# Patient Record
Sex: Male | Born: 1955 | Race: Black or African American | Hispanic: No | Marital: Married | Smoking: Former smoker
Health system: Southern US, Community
[De-identification: ages and names within clinical notes are randomized; demographics above are authoritative.]

## PROBLEM LIST (undated history)

## (undated) DIAGNOSIS — E119 Type 2 diabetes mellitus without complications: Secondary | ICD-10-CM

## (undated) DIAGNOSIS — I10 Essential (primary) hypertension: Secondary | ICD-10-CM

## (undated) HISTORY — DX: Type 2 diabetes mellitus without complications: E11.9

---

## 2005-05-09 ENCOUNTER — Emergency Department: Payer: Self-pay | Admitting: Emergency Medicine

## 2005-07-06 ENCOUNTER — Ambulatory Visit: Payer: Self-pay | Admitting: Pain Medicine

## 2005-07-06 ENCOUNTER — Other Ambulatory Visit: Payer: Self-pay

## 2005-07-07 ENCOUNTER — Observation Stay: Payer: Self-pay | Admitting: Internal Medicine

## 2005-07-13 ENCOUNTER — Ambulatory Visit: Payer: Self-pay | Admitting: Pain Medicine

## 2005-08-11 ENCOUNTER — Ambulatory Visit: Payer: Self-pay | Admitting: Pain Medicine

## 2005-10-19 ENCOUNTER — Ambulatory Visit: Payer: Self-pay | Admitting: Pain Medicine

## 2005-10-25 ENCOUNTER — Ambulatory Visit: Payer: Self-pay | Admitting: Pain Medicine

## 2005-12-02 ENCOUNTER — Ambulatory Visit: Payer: Self-pay | Admitting: Pain Medicine

## 2005-12-13 ENCOUNTER — Ambulatory Visit: Payer: Self-pay | Admitting: Pain Medicine

## 2006-01-26 ENCOUNTER — Ambulatory Visit: Payer: Self-pay | Admitting: Pain Medicine

## 2006-02-14 ENCOUNTER — Ambulatory Visit: Payer: Self-pay | Admitting: Pain Medicine

## 2006-03-03 ENCOUNTER — Ambulatory Visit: Payer: Self-pay | Admitting: Pain Medicine

## 2006-03-12 ENCOUNTER — Ambulatory Visit: Payer: Self-pay | Admitting: Neurological Surgery

## 2006-03-31 ENCOUNTER — Ambulatory Visit: Payer: Self-pay | Admitting: Pain Medicine

## 2006-04-06 ENCOUNTER — Ambulatory Visit: Payer: Self-pay | Admitting: Pain Medicine

## 2006-05-21 ENCOUNTER — Emergency Department: Payer: Self-pay | Admitting: Emergency Medicine

## 2006-10-11 ENCOUNTER — Ambulatory Visit: Payer: Self-pay | Admitting: Pain Medicine

## 2006-11-15 ENCOUNTER — Emergency Department: Payer: Self-pay | Admitting: Emergency Medicine

## 2006-11-15 HISTORY — PX: SPINE SURGERY: SHX786

## 2006-11-15 HISTORY — PX: BACK SURGERY: SHX140

## 2006-11-21 ENCOUNTER — Ambulatory Visit: Payer: Self-pay | Admitting: Pain Medicine

## 2007-03-22 ENCOUNTER — Inpatient Hospital Stay (HOSPITAL_COMMUNITY): Admission: RE | Admit: 2007-03-22 | Discharge: 2007-03-24 | Payer: Self-pay | Admitting: Neurological Surgery

## 2007-04-24 ENCOUNTER — Encounter: Admission: RE | Admit: 2007-04-24 | Discharge: 2007-04-24 | Payer: Self-pay | Admitting: Neurological Surgery

## 2007-07-10 ENCOUNTER — Encounter: Admission: RE | Admit: 2007-07-10 | Discharge: 2007-07-10 | Payer: Self-pay | Admitting: Neurological Surgery

## 2008-03-25 ENCOUNTER — Encounter: Admission: RE | Admit: 2008-03-25 | Discharge: 2008-03-25 | Payer: Self-pay | Admitting: Neurological Surgery

## 2008-08-18 ENCOUNTER — Emergency Department: Payer: Self-pay | Admitting: Emergency Medicine

## 2008-10-14 ENCOUNTER — Observation Stay: Payer: Self-pay | Admitting: Internal Medicine

## 2010-01-12 ENCOUNTER — Emergency Department: Payer: Self-pay | Admitting: Emergency Medicine

## 2011-04-02 NOTE — Discharge Summary (Signed)
Gregg Hernandez, Gregg Hernandez NO.:  [id number]   MEDICAL RECORD NO.:  [id number]          PATIENT TYPE:  INP   LOCATION:  3004                         FACILITY:  [facility]   PHYSICIAN:  Tia Alert, MD     DATE OF BIRTH:  08/28/1956   DATE OF ADMISSION:  03/22/2007  DATE OF DISCHARGE:  03/24/2007                               DISCHARGE SUMMARY   ADMISSION DIAGNOSIS:  Lumbar disk herniation, L4-L5, with segmental  instability and degenerative disk disease.   PROCEDURE PERFORMED:  Posterior lumbar interbody fusion with non  segmental instrumentation, L4-L5.   HISTORY OF PRESENT ILLNESS:  Gregg Hernandez is a 55 year old gentleman who  presented with severe back pain.  He has had that for several years.  It  was associated with left leg pain.  He had an MRI scan and a CT scan  which showed significant disk degeneration with a large disk herniation  at L4-L5 to the left.  He had a provocative diskogram which was positive  at L4-L5 with concordant pain with normal controls at L3-L4 and L5-S1.  He had tried medical management for quite some time without significant  relief.  I recommended transforaminal interbody fusion at L4-L5,  followed by non segmental instrumentation.  He understood the risks,  benefits and expected outcome and wished to proceed.   HOSPITAL COURSE:  The patient was admitted on Mar 22, 2007 and taken to  the operating room where he underwent a PLIF at L4-L5 with non segmental  instrumentation.  The patient tolerated the procedure well and was taken  to the recovery room and then to the floor in stable condition.  For  details of the operative procedure, please see the dictated operative  note.   The patient's hospital course was routine.  There were no complications.  The incision remained clean, dry and intact.  He remained afebrile with  stable vital signs.  He was advanced to a regular diet which he  tolerated well.  He remained on a PCA for a couple of  days and then was  weaned off of this and he tolerated oral pain medications quite well.  He was ambulating in the halls without difficulty, he had improvement in  his leg pain, he had appropriate back soreness.  He was discharged to  home in stable condition on Mar 24, 2007, with plans to follow up with  Gregg Hernandez in two weeks.   DISCHARGE DIAGNOSIS:  Posterior lumbar interbody fusion, L4-L5.      Tia Alert, MD  Electronically Signed     DSJ/MEDQ  D:  04/28/2007  T:  04/28/2007  Job:  [id number]

## 2011-04-02 NOTE — Op Note (Signed)
Gregg Hernandez, Gregg Hernandez NO.:  [id number]   MEDICAL RECORD NO.:  [id number]          PATIENT TYPE:  INP   LOCATION:  3172                         FACILITY:  [facility]   PHYSICIAN:  Tia Alert, MD     DATE OF BIRTH:  08-Jun-1956   DATE OF PROCEDURE:  03/22/2007  DATE OF DISCHARGE:                               OPERATIVE REPORT   PREOPERATIVE DIAGNOSIS:  Lumbar disk herniation with degenerative disk  disease L4-5 with back and leg pain.   POSTOPERATIVE DIAGNOSIS:  Lumbar disk herniation with degenerative disk  disease L4-5 with back and leg pain.   PROCEDURE:  1. Decompressive lumbar hemilaminectomy, hemifacetectomy and      foraminotomy L4-5 on the left followed by diskectomy L4-5 on the      left requiring more work than is usually required for the typical      TLIF procedure.  2. Transforaminal lumbar interbody fusion L4-5 on the left utilizing a      9-mm PEEK interbody cage packed local autograft and Actifuse.  The      interspace was packed with local autograft and Actifuse prior to      placement of the cage.  3. Intertransverse arthrodesis L4-5 on the left utilizing autograft      and Actifuse onlay fusion L4-5 on the right utilizing Actifuse and      autograft.  4. Nonsegmental fixation L4-5 utilizing the encompass pedicle screw      system at L4-5 on the left and a Spire plate at Z6-1.   SURGEON:  Tia Alert, M.D.   ASSISTANT:  Dr. Jordan Hernandez.   ANESTHESIA:  General endotracheal.   COMPLICATIONS:  None apparent.   INDICATIONS FOR PROCEDURE:  Gregg Hernandez is a 55 year old gentleman who  presented with severe back pain.  He has had this for several years.  He  had associated left leg pain.  He had an MRI, a CT scan and a  provocative diskogram which showed disk herniation at L4-5 to the left  with significant disk desiccation and collapse of the disk space with a  positive provocative diskogram at L4-5 with normal controls at  L3-4 and  L5-S1.  He  tried medical management for quite some time without  significant relief.  I recommended a transforaminal lumbar interbody  fusion at L4-5 followed by nonsegmental instrumentation.  He understood  the risks, benefits, and expected outcome and wished to proceed.   DESCRIPTION OF PROCEDURE:  The patient was taken to the operating room  and after induction of adequate generalized endotracheal anesthesia he  was rolled into prone position on the Wilson frame.  All pressure points  were padded.  His lumbar region was prepped with DuraPrep and then  draped in the usual sterile fashion.  Ten mL of local anesthesia was  injected, and a dorsal midline incision was made and carried down to the  lumbosacral fascia.  The fascia was opened and the paraspinous  musculature was taken down in a subperiosteal fashion to expose L4-5  bilaterally.  Intraoperative fluoroscopy confirmed my level, and then I  took  the dissection out over the facets bilaterally and placed a self-  retaining retractor.  I performed a hemilaminectomy, hemifacetectomy and  foraminotomy at L4-5 on the left side, identifying the L4 and L5 nerve  roots and decompressing these.  The ligament was removed.  The dura was  retracted medially and a large subannular disk herniation was  identified.  I then incised the disk and performed a thorough  intradiskal diskectomy with pituitary rongeurs.  I then used a series of  curettes to clean the disk space and prepare the endplates for fusion,  reaching across the midline.  I then used distraction to measure the  interspace to 9 mm.  Once the disk space was prepared, I used a mixture  of local autograft and Actifuse and packed this into the L4-5 interspace  into the midline and anteriorly.  I then placed a 9-mm PEEK interbody  cage which was also packed with local autograft and Actifuse and tapped  this into position at L4-5 and then checked this construct with  fluoroscopy.  I then inspected  the nerve roots once again, located the  pedicle screw entry zones at L4 and L5 on the left side using surface  landmarks and lateral fluoroscopy.  I probed each pedicle with a pedicle  probe, tapped each pedicle with a 6.0 tap and then placed 6.5 x 45 mm  pedicle screws into the L4 and L5 pedicles on the left side.  A rod was  then placed in the multiaxial screw heads and locked into position after  achieving compression of the graft.  I then decorticated the transverse  processes at L4-5 on the left side and placed a mixture of Actifuse and  local autograft out over these.  I then used a Spire plate and attached  this to the spinous processes of L4 and L5, locking the plate into the  spinous processes with the locking screw after achieving compression of  the Spire plate itself.  We then irrigated with saline solution  containing bacitracin, decorticated the lamina of L4 and L5 along with  the transverse processes and placed a mixture of local autograft and  Actifuse out over these for only intertransverse arthrodesis at L4-5 on  the right side.  We then inspected the nerve root once again, irrigated  with saline solution containing bacitracin, dried all bleeding points,  lined the dura with Gelfoam, placed a medium Hemovac drain through a  separate stab incision and then closed the fascia with #1 Vicryl, closed  the subcutaneous and subcuticular tissue with 2-0 and 3-0 Vicryl and  closed the skin with benzoin and Steri-Strips.  The drapes were removed.  A sterile dressing was applied.  The patient was awakened from general  anesthesia and transported to the recovery room in stable condition.  At  the end of procedure all sponge, needle and instrument counts were  correct.      Tia Alert, MD  Electronically Signed     DSJ/MEDQ  D:  03/22/2007  T:  03/22/2007  Job:  [id number]

## 2013-11-11 ENCOUNTER — Emergency Department: Payer: Self-pay | Admitting: Emergency Medicine

## 2013-11-11 LAB — TROPONIN I
Troponin-I: <0.02 ng/mL
Troponin-I: <0.02 ng/mL

## 2013-11-11 LAB — BASIC METABOLIC PANEL
Anion Gap: 4 — ABNORMAL LOW (ref 7–16)
BUN: 10 mg/dL (ref 7–18)
Calcium, Total: 8.5 mg/dL (ref 8.5–10.1)
Creatinine: 1.14 mg/dL (ref 0.60–1.30)
Glucose: 392 mg/dL — ABNORMAL HIGH (ref 65–99)
Potassium: 3.6 mmol/L (ref 3.5–5.1)
Sodium: 135 mmol/L — ABNORMAL LOW (ref 136–145)

## 2013-11-11 LAB — CBC
HCT: 41.7 % (ref 40.0–52.0)
HGB: 13.8 g/dL (ref 13.0–18.0)
MCH: 26.9 pg (ref 26.0–34.0)
Platelet: 134 10*3/uL — ABNORMAL LOW (ref 150–440)
RDW: 14 % (ref 11.5–14.5)
WBC: 8.6 10*3/uL (ref 3.8–10.6)

## 2013-11-15 DIAGNOSIS — E119 Type 2 diabetes mellitus without complications: Secondary | ICD-10-CM

## 2013-11-15 HISTORY — DX: Type 2 diabetes mellitus without complications: E11.9

## 2015-01-23 ENCOUNTER — Encounter: Payer: Self-pay | Admitting: *Deleted

## 2015-02-06 ENCOUNTER — Encounter: Payer: Self-pay | Admitting: General Surgery

## 2015-02-06 ENCOUNTER — Ambulatory Visit (INDEPENDENT_AMBULATORY_CARE_PROVIDER_SITE_OTHER): Payer: BC Managed Care – PPO | Admitting: General Surgery

## 2015-02-06 VITALS — BP 130/88 | HR 78 | Resp 14 | Ht 69.0 in | Wt 207.0 lb

## 2015-02-06 DIAGNOSIS — Z1211 Encounter for screening for malignant neoplasm of colon: Secondary | ICD-10-CM | POA: Diagnosis not present

## 2015-02-06 MED ORDER — POLYETHYLENE GLYCOL 3350 17 GM/SCOOP PO POWD: ORAL | Status: DC

## 2015-02-06 NOTE — Progress Notes (Signed)
Patient ID: Gregg Hernandez, male   DOB: 10-09-1956, 59 y.o.   MRN: [id number]  Chief Complaint  Patient presents with  . Colonoscopy    HPI Gregg Hernandez is a 59 y.o. male.  Here today to discuss having a colonoscopy. He has never has one done previously. He does not report any problems with his bowels. He was diagnosed with diabetes in 2015 and modified his diet at that time. His weight is down 33 pounds over the past year. HPI  Past Medical History  Diagnosis Date  . Diabetes mellitus without complication 9407    Past Surgical History  Procedure Laterality Date  . Back surgery  2008  . Spine surgery  2008     lower back ruptured disk, Dr Gregg Hernandez, Gregg Hernandez    History reviewed. No pertinent family history.  Social History History  Substance Use Topics  . Smoking status: Current Every Day Smoker -- 0.50 packs/day for 20 years    Types: Cigarettes  . Smokeless tobacco: Never Used  . Alcohol Use: No    No Known Allergies  Current Outpatient Prescriptions  Medication Sig Dispense Refill  . amLODipine-benazepril (LOTREL) 5-20 MG per capsule Take 1 capsule by mouth daily.     . metFORMIN (GLUCOPHAGE) 500 MG tablet Take 500 mg by mouth 2 (two) times daily with a meal.     . polyethylene glycol powder (GLYCOLAX/MIRALAX) powder 255 grams one bottle for colonoscopy prep 255 g 0   No current facility-administered medications for this visit.    Review of Systems Review of Systems  Constitutional: Negative.   Respiratory: Negative.   Cardiovascular: Negative.   Gastrointestinal: Negative.     Blood pressure 130/88, pulse 78, resp. rate 14, height 5\' 9"  (1.753 m), weight 207 lb (93.895 kg).  Physical Exam Physical Exam  Constitutional: He is oriented to person, place, and time. He appears well-developed and well-nourished.  Eyes: Conjunctivae are normal. No scleral icterus.  Neck: Neck supple.  Cardiovascular: Normal rate, regular rhythm and normal heart sounds.    Pulmonary/Chest: Effort normal and breath sounds normal.  Lymphadenopathy:    He has no cervical adenopathy.  Neurological: He is alert and oriented to person, place, and time.  Skin: Skin is warm and dry.    Data Reviewed PCP notes of 01/10/2015 reviewed. Hemoglobin A1c down at 6.4. Original A1c 10.9.  Assessment    Candidate for screening colonoscopy.     Plan    The pros and cons of screening colonoscopy were reviewed. The risks associated with the procedure including bleeding and perforation were discussed.   Patient has been scheduled for a colonoscopy on 03-19-15 at [facility]. This patient has been asked to hold Metformin day of colonoscopy prep and procedure.      PCP/Ref: Dr Gregg Hernandez, Gregg Hernandez 02/07/2015, 9:47 AM

## 2015-02-06 NOTE — Patient Instructions (Addendum)
Colonoscopy A colonoscopy is an exam to look at the entire large intestine (colon). This exam can help find problems such as tumors, polyps, inflammation, and areas of bleeding. The exam takes about 1 hour.  LET [facility] CARE PROVIDER KNOW ABOUT:   Any allergies you have.  All medicines you are taking, including vitamins, herbs, eye drops, creams, and over-the-counter medicines.  Previous problems you or members of your family have had with the use of anesthetics.  Any blood disorders you have.  Previous surgeries you have had.  Medical conditions you have. RISKS AND COMPLICATIONS  Generally, this is a safe procedure. However, as with any procedure, complications can occur. Possible complications include:  Bleeding.  Tearing or rupture of the colon wall.  Reaction to medicines given during the exam.  Infection (rare). BEFORE THE PROCEDURE   Ask your health care provider about changing or stopping your regular medicines.  You may be prescribed an oral bowel prep. This involves drinking a large amount of medicated liquid, starting the day before your procedure. The liquid will cause you to have multiple loose stools until your stool is almost clear or light green. This cleans out your colon in preparation for the procedure.  Do not eat or drink anything else once you have started the bowel prep, unless your health care provider tells you it is safe to do so.  Arrange for someone to drive you home after the procedure. PROCEDURE   You will be given medicine to help you relax (sedative).  You will lie on your side with your knees bent.  A long, flexible tube with a light and camera on the end (colonoscope) will be inserted through the rectum and into the colon. The camera sends video back to a computer screen as it moves through the colon. The colonoscope also releases carbon dioxide gas to inflate the colon. This helps your health care provider see the area better.  During  the exam, your health care provider may take a small tissue sample (biopsy) to be examined under a microscope if any abnormalities are found.  The exam is finished when the entire colon has been viewed. AFTER THE PROCEDURE   Do not drive for 24 hours after the exam.  You may have a small amount of blood in your stool.  You may pass moderate amounts of gas and have mild abdominal cramping or bloating. This is caused by the gas used to inflate your colon during the exam.  Ask when your test results will be ready and how you will get your results. Make sure you get your test results. Document Released: 10/29/2000 Document Revised: 08/22/2013 Document Reviewed: 07/09/2013 [facility] Patient Information 2015 [place], [place]. This information is not intended to replace advice given to you by your health care provider. Make sure you discuss any questions you have with your health care provider.  Patient has been scheduled for a colonoscopy on 03-19-15 at [facility]. This patient has been asked to hold Metformin day of colonoscopy prep and procedure.

## 2015-02-07 ENCOUNTER — Encounter: Payer: Self-pay | Admitting: General Surgery

## 2015-02-07 ENCOUNTER — Other Ambulatory Visit: Payer: Self-pay | Admitting: General Surgery

## 2015-02-07 DIAGNOSIS — Z1211 Encounter for screening for malignant neoplasm of colon: Secondary | ICD-10-CM | POA: Insufficient documentation

## 2015-03-13 ENCOUNTER — Telehealth: Payer: Self-pay | Admitting: *Deleted

## 2015-03-13 NOTE — Telephone Encounter (Signed)
Patient reports no medication changes since last office visit.   He reports he has pre-registered but has yet to pick up Miralax prescription.   We will proceed with colonoscopy that is scheduled at [facility] for 03-19-15.   Patient instructed to call the office if he has further questions.

## 2015-03-19 ENCOUNTER — Ambulatory Visit: Payer: BC Managed Care – PPO | Admitting: Anesthesiology

## 2015-03-19 ENCOUNTER — Encounter: Payer: Self-pay | Admitting: *Deleted

## 2015-03-19 ENCOUNTER — Ambulatory Visit
Admission: RE | Admit: 2015-03-19 | Discharge: 2015-03-19 | Disposition: A | Discharge status: Home / Self Care | Payer: BC Managed Care – PPO | Source: Ambulatory Visit | Attending: General Surgery | Admitting: General Surgery

## 2015-03-19 ENCOUNTER — Encounter
Admission: RE | Disposition: A | Discharge status: Home / Self Care | Payer: Self-pay | Source: Ambulatory Visit | Attending: General Surgery

## 2015-03-19 DIAGNOSIS — Z79899 Other long term (current) drug therapy: Secondary | ICD-10-CM | POA: Diagnosis not present

## 2015-03-19 DIAGNOSIS — D122 Benign neoplasm of ascending colon: Secondary | ICD-10-CM | POA: Insufficient documentation

## 2015-03-19 DIAGNOSIS — D125 Benign neoplasm of sigmoid colon: Secondary | ICD-10-CM | POA: Diagnosis not present

## 2015-03-19 DIAGNOSIS — Z1211 Encounter for screening for malignant neoplasm of colon: Secondary | ICD-10-CM | POA: Diagnosis present

## 2015-03-19 DIAGNOSIS — Z9889 Other specified postprocedural states: Secondary | ICD-10-CM | POA: Insufficient documentation

## 2015-03-19 DIAGNOSIS — E119 Type 2 diabetes mellitus without complications: Secondary | ICD-10-CM | POA: Insufficient documentation

## 2015-03-19 DIAGNOSIS — F17210 Nicotine dependence, cigarettes, uncomplicated: Secondary | ICD-10-CM | POA: Diagnosis not present

## 2015-03-19 DIAGNOSIS — I10 Essential (primary) hypertension: Secondary | ICD-10-CM | POA: Insufficient documentation

## 2015-03-19 HISTORY — DX: Essential (primary) hypertension: I10

## 2015-03-19 HISTORY — PX: COLONOSCOPY: SHX5424

## 2015-03-19 SURGERY — COLONOSCOPY
Anesthesia: General

## 2015-03-19 MED ORDER — SODIUM CHLORIDE 0.45 % IV SOLN
INTRAVENOUS | Status: DC
  Administered 2015-03-19: 1000 mL via INTRAVENOUS

## 2015-03-19 MED ORDER — SODIUM CHLORIDE 0.9 % IV SOLN
INTRAVENOUS | Status: DC
  Administered 2015-03-19: 10:00:00 via INTRAVENOUS

## 2015-03-19 MED ORDER — PROPOFOL INFUSION 10 MG/ML OPTIME
INTRAVENOUS | Status: DC | PRN
  Administered 2015-03-19: 200 ug/kg/min via INTRAVENOUS

## 2015-03-19 NOTE — Op Note (Signed)
[facility] Patient Name: Gregg Hernandez Procedure Date: 03/19/2015 10:11 AM MRN: [id number] Account #: [id number] Date of Birth: [birth year] Admit Type: Outpatient Age: 59 Room: [facility] ENDO ROOM 1 Gender: Male Note Status: Finalized Procedure:         Colonoscopy Indications:       Screening for colorectal malignant neoplasm Providers:         Robert Bellow, MD Referring MD:      Leona Carry. Hall Busing, MD (Referring MD) Medicines:         Monitored Anesthesia Care Complications:     No immediate complications. Procedure:         Pre-Anesthesia Assessment:                    - Prior to the procedure, a History and Physical was                     performed, and patient medications, allergies and                     sensitivities were reviewed. The patient's tolerance of                     previous anesthesia was reviewed.                    - The risks and benefits of the procedure and the sedation                     options and risks were discussed with the patient. All                     questions were answered and informed consent was obtained.                    - Patient identification and proposed procedure were                     verified prior to the procedure by the physician. The                     procedure was verified in the procedure room.                    - The anesthesia plan was to use moderate                     sedation/analgesia (conscious sedation).                    After obtaining informed consent, the colonoscope was                     passed under direct vision. Throughout the procedure, the                     patient's blood pressure, pulse, and oxygen saturations                     were monitored continuously. The Colonoscope was                     introduced through the anus and advanced to the the                     terminal ileum.  The colonoscopy was performed without                     difficulty. The patient  tolerated the procedure well. The                     quality of the bowel preparation was excellent. Findings:      A 5 mm polyp was found in the ascending colon. The polyp was sessile.       Biopsies were taken with a cold forceps for histology.      A 9 mm polyp was found in the sigmoid colon. The polyp was pedunculated.       The polyp was removed with a cold snare. Resection and retrieval were       complete.      The retroflexed view of the distal rectum and anal verge was normal and       showed no anal or rectal abnormalities. Impression:        - One 5 mm polyp in the ascending colon. Biopsied.                    - One 9 mm polyp in the sigmoid colon. Resected and                     retrieved.                    - The distal rectum and anal verge are normal on                     retroflexion view. Recommendation:    - Telephone endoscopist for pathology results in 1 week. Procedure Code(s): --- Professional ---                    [phone], Colonoscopy, flexible; with removal of tumor(s),                     polyp(s), or other lesion(s) by snare technique                    45380, 34, Colonoscopy, flexible; with biopsy, single or                     multiple Diagnosis Code(s): --- Professional ---                    Z12.11, Encounter for screening for malignant neoplasm of                     colon                    D12.2, Benign neoplasm of ascending colon                    D12.5, Benign neoplasm of sigmoid colon CPT copyright 2014 American Medical Association. All rights reserved. The codes documented in this report are preliminary and upon coder review may  be revised to meet current compliance requirements. Robert Bellow, MD 03/19/2015 11:06:58 AM This report has been signed electronically. Number of Addenda: 0 Note Initiated On: 03/19/2015 10:11 AM Scope Withdrawal Time: 0 hours 15 minutes 53 seconds  Total Procedure Duration: 0 hours 25 minutes 12 seconds        [facility]

## 2015-03-19 NOTE — Anesthesia Postprocedure Evaluation (Signed)
  Anesthesia Post-op Note  Patient: Gregg Hernandez  Procedure(s) Performed: Procedure(s): COLONOSCOPY (N/A)  Anesthesia type:General  Patient location: PACU  Post pain: Pain level controlled  Post assessment: Post-op Vital signs reviewed, Patient's Cardiovascular Status Stable, Respiratory Function Stable, Patent Airway and No signs of Nausea or vomiting  Post vital signs: Reviewed and stable  Last Vitals:  Filed Vitals:   03/19/15 1126  BP: 111/65  Pulse:   Temp: 36.1 C  Resp: 18    Level of consciousness: awake, alert  and patient cooperative  Complications: No apparent anesthesia complications

## 2015-03-19 NOTE — Anesthesia Preprocedure Evaluation (Addendum)
Anesthesia Evaluation  Patient identified by MRN, date of birth, ID band Patient awake    Airway Mallampati: II       Dental   Pulmonary Current Smoker,          Cardiovascular hypertension,     Neuro/Psych    GI/Hepatic   Endo/Other  diabetes  Renal/GU      Musculoskeletal   Abdominal   Peds  Hematology   Anesthesia Other Findings   Reproductive/Obstetrics                            Anesthesia Physical Anesthesia Plan  ASA: III  Anesthesia Plan: General   Post-op Pain Management:    Induction: Intravenous  Airway Management Planned: Nasal Cannula  Additional Equipment:   Intra-op Plan:   Post-operative Plan:   Informed Consent:   Plan Discussed with: CRNA, Anesthesiologist and Surgeon  Anesthesia Plan Comments:        Anesthesia Quick Evaluation

## 2015-03-19 NOTE — H&P (Signed)
HPI Gregg Hernandez is a 59 y.o. male. Here today to discuss having a colonoscopy. He has never has one done previously. He does not report any problems with his bowels. He was diagnosed with diabetes in 2015 and modified his diet at that time. His weight is down 33 pounds over the past year. HPI  Past Medical History  Diagnosis Date  . Diabetes mellitus without complication 4097    Past Surgical History  Procedure Laterality Date  . Back surgery  2008  . Spine surgery  2008     lower back ruptured disk, Dr Ronnald Ramp, [name]    History reviewed. No pertinent family history.  Social History History  Substance Use Topics  . Smoking status: Current Every Day Smoker -- 0.50 packs/day for 20 years    Types: Cigarettes  . Smokeless tobacco: Never Used  . Alcohol Use: No    No Known Allergies  Current Outpatient Prescriptions  Medication Sig Dispense Refill  . amLODipine-benazepril (LOTREL) 5-20 MG per capsule Take 1 capsule by mouth daily.     . metFORMIN (GLUCOPHAGE) 500 MG tablet Take 500 mg by mouth 2 (two) times daily with a meal.     . polyethylene glycol powder (GLYCOLAX/MIRALAX) powder 255 grams one bottle for colonoscopy prep 255 g 0   No current facility-administered medications for this visit.    Review of Systems Review of Systems  Constitutional: Negative.  Respiratory: Negative.  Cardiovascular: Negative.  Gastrointestinal: Negative.    Blood pressure 130/88, pulse 78, resp. rate 14, height 5\' 9"  (1.753 m), weight 207 lb (93.895 kg).  Physical Exam Physical Exam  Constitutional: He is oriented to person, place, and time. He appears well-developed and well-nourished.  Eyes: Conjunctivae are normal. No scleral icterus.  Neck: Neck supple.  Cardiovascular: Normal rate, regular rhythm and normal heart sounds.  Pulmonary/Chest: Effort normal and breath sounds normal.  Lymphadenopathy:    He has no cervical adenopathy.  Neurological: He is alert and oriented to person, place, and time.  Skin: Skin is warm and dry.    Data Reviewed PCP notes of 01/10/2015 reviewed. Hemoglobin A1c down at 6.4. Original A1c 10.9.  Assessment    Candidate for screening colonoscopy.     Plan    The pros and cons of screening colonoscopy were reviewed. The risks associated with the procedure including bleeding and perforation were discussed.   Patient has been scheduled for a colonoscopy on 03-19-15 at [facility]. This patient has been asked to hold Metformin day of colonoscopy prep and procedure.      PCP/Ref: Dr Annett Fabian, [name] 02/07/2015, 9:47 AM  Patient remains asymptomatic re: cardiopulmonary symptoms. Tolerated prep well.  HEENT: Neg (mild sinus congestion). Lungs: Clear. Cardio: RR.

## 2015-03-19 NOTE — Transfer of Care (Signed)
Immediate Anesthesia Transfer of Care Note  Patient: Gregg Hernandez  Procedure(s) Performed: Procedure(s): COLONOSCOPY (N/A)  Patient Location: PACU  Anesthesia Type:General  Level of Consciousness: awake and alert   Airway & Oxygen Therapy: Patient Spontanous Breathing and Patient connected to nasal cannula oxygen  Post-op Assessment: Report given to RN  Post vital signs: Reviewed and stable  Last Vitals:  Filed Vitals:   03/19/15 0911  BP: 140/83  Pulse: 61  Temp: 36.9 C  Resp: 16    Complications: No apparent anesthesia complications

## 2015-03-19 NOTE — Transfer of Care (Signed)
Immediate Anesthesia Transfer of Care Note  Patient: Gregg Hernandez  Procedure(s) Performed: Procedure(s): COLONOSCOPY (N/A)  Patient Location: PACU  Anesthesia Type:General  Level of Consciousness: sedated  Airway & Oxygen Therapy: Patient Spontanous Breathing  Post-op Assessment: Report given to RN and Post -op Vital signs reviewed and stable  Post vital signs: Reviewed and stable  Last Vitals:  Filed Vitals:   03/19/15 0911  BP: 140/83  Pulse: 61  Temp: 36.9 C  Resp: 16    Complications: No apparent anesthesia complications

## 2015-03-20 ENCOUNTER — Encounter: Payer: Self-pay | Admitting: General Surgery

## 2015-03-20 LAB — SURGICAL PATHOLOGY

## 2015-03-21 ENCOUNTER — Telehealth: Payer: Self-pay | Admitting: General Surgery

## 2015-03-21 NOTE — Telephone Encounter (Signed)
The patient's wife was notified that the polyps removed at the time of his recent colonoscopy were benign. A repeat exam in 5 years is appropriate. She reports the patient tolerated the procedure well. 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

## 2017-07-25 ENCOUNTER — Emergency Department
Admission: EM | Admit: 2017-07-25 | Discharge: 2017-07-25 | Disposition: A | Discharge status: Home / Self Care | Payer: BC Managed Care – PPO | Attending: Emergency Medicine | Admitting: Emergency Medicine

## 2017-07-25 ENCOUNTER — Encounter: Payer: Self-pay | Admitting: *Deleted

## 2017-07-25 DIAGNOSIS — M5441 Lumbago with sciatica, right side: Secondary | ICD-10-CM | POA: Diagnosis not present

## 2017-07-25 DIAGNOSIS — Z79899 Other long term (current) drug therapy: Secondary | ICD-10-CM | POA: Diagnosis not present

## 2017-07-25 DIAGNOSIS — I10 Essential (primary) hypertension: Secondary | ICD-10-CM | POA: Insufficient documentation

## 2017-07-25 DIAGNOSIS — F17210 Nicotine dependence, cigarettes, uncomplicated: Secondary | ICD-10-CM | POA: Insufficient documentation

## 2017-07-25 DIAGNOSIS — M545 Low back pain: Secondary | ICD-10-CM | POA: Diagnosis present

## 2017-07-25 DIAGNOSIS — E119 Type 2 diabetes mellitus without complications: Secondary | ICD-10-CM | POA: Diagnosis not present

## 2017-07-25 MED ORDER — HYDROCODONE-ACETAMINOPHEN 5-325 MG PO TABS: 1.0000 | ORAL_TABLET | Freq: Four times a day (QID) | ORAL | 0 refills | Status: DC | PRN

## 2017-07-25 MED ORDER — KETOROLAC TROMETHAMINE 30 MG/ML IJ SOLN
30.0000 mg | Freq: Once | INTRAMUSCULAR | Status: AC
  Administered 2017-07-25: 30 mg via INTRAMUSCULAR
  Filled 2017-07-25: qty 1

## 2017-07-25 MED ORDER — METHOCARBAMOL 500 MG PO TABS
1000.0000 mg | ORAL_TABLET | Freq: Once | ORAL | Status: AC
Start: 2017-07-25 — End: 2017-07-25
  Administered 2017-07-25: 1000 mg via ORAL
  Filled 2017-07-25: qty 2

## 2017-07-25 MED ORDER — METHOCARBAMOL 500 MG PO TABS: ORAL_TABLET | ORAL | 0 refills | Status: DC

## 2017-07-25 MED ORDER — ETODOLAC 400 MG PO TABS: 400.0000 mg | ORAL_TABLET | Freq: Two times a day (BID) | ORAL | 0 refills | Status: DC

## 2017-07-25 NOTE — ED Notes (Signed)
See triage note.  Having lower back pain after doing yard work  States pain is radiating into right leg  Ambulates well with sl limp d/t pain  Denies any trauma or urinary sx's

## 2017-07-25 NOTE — ED Triage Notes (Signed)
States after doing a lot of yard work last week he developed lower back pain that goes down his right leg, ambulatory, awake and alert

## 2017-07-25 NOTE — Discharge Instructions (Signed)
Follow-up with your primary care doctor if any continued problems. Continue taking Robaxin, etodolac and Norco if needed for pain. Use warm compresses or ice to her back as needed. Do not drive or operate machinery while taking Robaxin for Norco. Etodolac is for inflammation and pain and does not cause drowsiness and therefore this medication is safe to take while driving.

## 2017-07-25 NOTE — ED Provider Notes (Signed)
[facility] Emergency Department Provider Note  ____________________________________________   First MD Initiated Contact with Patient 07/25/17 1034     (approximate)  I have reviewed the triage vital signs and the nursing notes.   HISTORY  Chief Complaint Back Pain   HPI Gregg Hernandez is a 61 y.o. male is here complaining of right-sided lower back pain. Patient states he ate a lot of yard work last week. He states he was mowing and cutting limbs and moving them. He denies any injury or fall. Patient has a history of back surgery in 2008. He states occasionally he has an occasional back pain but nothing like he is experiencing now. Pain also radiates down into his right leg stopping at his knee. He denies any urinary symptoms, incontinence of bowel or bladder or difficulty walking. He is taken over-the-counter medication without any relief. He rates his pain as a 10 over 10.   Past Medical History:  Diagnosis Date  . Diabetes mellitus without complication ([place]) 4742  . Hypertension     Patient Active Problem List   Diagnosis Date Noted  . Encounter for screening colonoscopy 02/07/2015    Past Surgical History:  Procedure Laterality Date  . BACK SURGERY  2008  . COLONOSCOPY N/A 03/19/2015   Procedure: COLONOSCOPY;  Surgeon: Robert Bellow, MD;  Location: [facility] ENDOSCOPY;  Service: Endoscopy;  Laterality: N/A;  . SPINE SURGERY  2008    lower back ruptured disk, Dr Elvia Collum    Prior to Admission medications   Medication Sig Start Date End Date Taking? Authorizing Provider  amLODipine-benazepril (LOTREL) 5-20 MG per capsule Take 1 capsule by mouth daily. Every morning 01/25/15   [provider]  etodolac (LODINE) 400 MG tablet Take 1 tablet (400 mg total) by mouth 2 (two) times daily. 07/25/17   Johnn Hai, PA-C  HYDROcodone-acetaminophen (NORCO/VICODIN) 5-325 MG tablet Take 1 tablet by mouth every 6 (six) hours as needed  for moderate pain. 07/25/17   Johnn Hai, PA-C  metFORMIN (GLUCOPHAGE) 500 MG tablet Take 500 mg by mouth 2 (two) times daily with a meal.  01/25/15   [provider]  methocarbamol (ROBAXIN) 500 MG tablet 1-2 tablets every 6 hours prn muscle spasms 07/25/17   Johnn Hai, PA-C    Allergies Patient has no known allergies.  History reviewed. No pertinent family history.  Social History Social History  Substance Use Topics  . Smoking status: Current Every Day Smoker    Packs/day: 0.50    Years: 20.00    Types: Cigarettes  . Smokeless tobacco: Never Used  . Alcohol use No    Review of Systems Constitutional: No fever/chills Cardiovascular: Denies chest pain. Respiratory: Denies shortness of breath. Gastrointestinal:   No nausea, no vomiting.   Genitourinary: Negative for dysuria. Musculoskeletal: Positive for back pain with right leg radiculopathy. Skin: Negative for rash. Neurological: Negative for headaches, focal weakness or numbness. ___________________________________________   PHYSICAL EXAM:  VITAL SIGNS: ED Triage Vitals  Enc Vitals Group     BP 07/25/17 1006 (!) 156/89     Pulse Rate 07/25/17 1006 61     Resp 07/25/17 1006 18     Temp 07/25/17 1006 98 F (36.7 C)     Temp Source 07/25/17 1006 Oral     SpO2 07/25/17 1006 99 %     Weight 07/25/17 1009 215 lb (97.5 kg)     Height 07/25/17 1009 5\' 7"  (1.702 m)  Head Circumference --      Peak Flow --      Pain Score 07/25/17 1009 10     Pain Loc --      Pain Edu? --      Excl. in [place]? --    Constitutional: Alert and oriented. Well appearing and in no acute distress. Eyes: Conjunctivae are normal.  Head: Atraumatic. Neck: No stridor.   Cardiovascular: Normal rate, regular rhythm. Grossly normal heart sounds.  Good peripheral circulation. Respiratory: Normal respiratory effort.  No retractions. Lungs CTAB. Gastrointestinal: Soft and nontender. No distention.  Musculoskeletal: On  examination back there is a well-healed surgical scar at approximately L4, L5-S1 area. There is minimal point tenderness on palpation of the lumbar spine. There is however marked tenderness on palpation of the right SI joint area and surrounding soft tissue. Range of motion is guarded. Straight leg raises on the right arm approximately 70. Good muscle strength bilaterally. Patient is able to slowly ambulate without assistance. Neurologic:  Normal speech and language. No gross focal neurologic deficits are appreciated. Reflexes are 1+ bilaterally. Skin:  Skin is warm, dry and intact. No rash noted. Psychiatric: Mood and affect are normal. Speech and behavior are normal.  ____________________________________________   LABS (all labs ordered are listed, but only abnormal results are displayed)  Labs Reviewed - No data to display  RADIOLOGY  No results found.  ____________________________________________   PROCEDURES  Procedure(s) performed: None  Procedures  Critical Care performed: No  ____________________________________________   INITIAL IMPRESSION / ASSESSMENT AND PLAN / ED COURSE  Pertinent labs & imaging results that were available during my care of the patient were reviewed by me and considered in my medical decision making (see chart for details).  ----------------------------------------- 11:58 AM on 07/25/2017 ----------------------------------------- Patient reports great improvement and that he is able to sleep and lay without pain. 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 Patient will continue taking Robaxin, etodolac and Norco if needed for severe pain. He is follow-up with his PCP if any continued problems or [facility].   ___________________________________________   FINAL CLINICAL IMPRESSION(S) / ED DIAGNOSES  Final diagnoses:  Acute right-sided low back pain with right-sided sciatica       NEW MEDICATIONS STARTED DURING THIS VISIT:  New Prescriptions   ETODOLAC (LODINE) 400 MG TABLET    Take 1 tablet (400 mg total) by mouth 2 (two) times daily.   HYDROCODONE-ACETAMINOPHEN (NORCO/VICODIN) 5-325 MG TABLET    Take 1 tablet by mouth every 6 (six) hours as needed for moderate pain.   METHOCARBAMOL (ROBAXIN) 500 MG TABLET    1-2 tablets every 6 hours prn muscle spasms     Note:  This document was prepared using Dragon voice recognition software and may include unintentional dictation errors.    Johnn Hai, PA-C 07/25/17 1205    Arta Silence, MD 07/25/17 [phone]

## 2017-07-31 ENCOUNTER — Emergency Department: Payer: BC Managed Care – PPO

## 2017-07-31 ENCOUNTER — Emergency Department
Admission: EM | Admit: 2017-07-31 | Discharge: 2017-07-31 | Disposition: A | Discharge status: Home / Self Care | Payer: BC Managed Care – PPO | Attending: Emergency Medicine | Admitting: Emergency Medicine

## 2017-07-31 DIAGNOSIS — E119 Type 2 diabetes mellitus without complications: Secondary | ICD-10-CM | POA: Insufficient documentation

## 2017-07-31 DIAGNOSIS — M5431 Sciatica, right side: Secondary | ICD-10-CM

## 2017-07-31 DIAGNOSIS — Z79899 Other long term (current) drug therapy: Secondary | ICD-10-CM | POA: Insufficient documentation

## 2017-07-31 DIAGNOSIS — F17210 Nicotine dependence, cigarettes, uncomplicated: Secondary | ICD-10-CM | POA: Insufficient documentation

## 2017-07-31 DIAGNOSIS — I10 Essential (primary) hypertension: Secondary | ICD-10-CM | POA: Insufficient documentation

## 2017-07-31 DIAGNOSIS — Z7984 Long term (current) use of oral hypoglycemic drugs: Secondary | ICD-10-CM | POA: Insufficient documentation

## 2017-07-31 LAB — GLUCOSE, CAPILLARY: GLUCOSE-CAPILLARY: 165 mg/dL — AB (ref 65–99)

## 2017-07-31 MED ORDER — KETOROLAC TROMETHAMINE 30 MG/ML IJ SOLN
30.0000 mg | Freq: Once | INTRAMUSCULAR | Status: AC
  Administered 2017-07-31: 30 mg via INTRAVENOUS
  Filled 2017-07-31: qty 1

## 2017-07-31 MED ORDER — METHYLPREDNISOLONE SODIUM SUCC 125 MG IJ SOLR
80.0000 mg | Freq: Once | INTRAMUSCULAR | Status: AC
  Administered 2017-07-31: 80 mg via INTRAMUSCULAR
  Filled 2017-07-31: qty 2

## 2017-07-31 MED ORDER — PREDNISONE 10 MG PO TABS: ORAL_TABLET | ORAL | 0 refills | Status: DC

## 2017-07-31 NOTE — ED Notes (Signed)
See triage note  states he was seen last Monday for same  Had some relief with meds   Then pain returned to lower back into right leg

## 2017-07-31 NOTE — ED Triage Notes (Signed)
Patient states he was seen here last Monday for back pain, dx with sciatic nerve pain. States he is having a flare up.

## 2017-07-31 NOTE — ED Provider Notes (Signed)
[facility] Emergency Department Provider Note  ____________________________________________  Time seen: Approximately 10:08 AM  I have reviewed the triage vital signs and the nursing notes.   HISTORY  Chief Complaint Back Pain    HPI Gregg Hernandez is a 61 y.o. male that presents to emergency department with low right sided back pain that radiates into his right leg for 1 week. He begins in his low right back and radiates down to his knee. It is sharp in character. Walking makes the pain worse. He was evaluated in this emergency department one week ago and was diagnosed with sciatica. He has been taking the medications as prescribed but symptoms are getting worse.He does not recall any trauma but was doing a lot of yard work the week before. He had back surgery in 2008. No bowel or bladder dysfunction or saddle paresthesias. He is walking but with pain. No abdominal pain, numbness, tingling.   Past Medical History:  Diagnosis Date  . Diabetes mellitus without complication ([place]) 6010  . Hypertension     Patient Active Problem List   Diagnosis Date Noted  . Encounter for screening colonoscopy 02/07/2015    Past Surgical History:  Procedure Laterality Date  . BACK SURGERY  2008  . COLONOSCOPY N/A 03/19/2015   Procedure: COLONOSCOPY;  Surgeon: Robert Bellow, MD;  Location: [facility] ENDOSCOPY;  Service: Endoscopy;  Laterality: N/A;  . SPINE SURGERY  2008    lower back ruptured disk, Dr Elvia Collum    Prior to Admission medications   Medication Sig Start Date End Date Taking? Authorizing Provider  amLODipine-benazepril (LOTREL) 5-20 MG per capsule Take 1 capsule by mouth daily. Every morning 01/25/15   [provider]  etodolac (LODINE) 400 MG tablet Take 1 tablet (400 mg total) by mouth 2 (two) times daily. 07/25/17   Johnn Hai, PA-C  HYDROcodone-acetaminophen (NORCO/VICODIN) 5-325 MG tablet Take 1 tablet by mouth every 6 (six) hours  as needed for moderate pain. 07/25/17   Johnn Hai, PA-C  metFORMIN (GLUCOPHAGE) 500 MG tablet Take 500 mg by mouth 2 (two) times daily with a meal.  01/25/15   [provider]  methocarbamol (ROBAXIN) 500 MG tablet 1-2 tablets every 6 hours prn muscle spasms 07/25/17   Johnn Hai, PA-C  predniSONE (DELTASONE) 10 MG tablet Take 6 tablets on day 1, take 5 tablets on day 2, take 4 tablets on day 3, take 3 tablets on day 4, take 2 tablets on day 5, take 1 tablet on day 6 07/31/17   Laban Emperor, PA-C    Allergies Patient has no known allergies.  No family history on file.  Social History Social History  Substance Use Topics  . Smoking status: Current Every Day Smoker    Packs/day: 0.50    Years: 20.00    Types: Cigarettes  . Smokeless tobacco: Never Used  . Alcohol use No     Review of Systems  Constitutional: No fever/chills Cardiovascular: No chest pain. Respiratory: No SOB. Gastrointestinal: No abdominal pain.  No nausea, no vomiting.  Musculoskeletal: Positive for back pain. Skin: Negative for rash, abrasions, lacerations, ecchymosis. Neurological: Negative for headaches, numbness or tingling   ____________________________________________   PHYSICAL EXAM:  VITAL SIGNS: ED Triage Vitals  Enc Vitals Group     BP 07/31/17 0955 (!) 174/83     Pulse Rate 07/31/17 0955 72     Resp 07/31/17 0955 13     Temp 07/31/17 0955 98.5 F (  36.9 C)     Temp Source 07/31/17 0955 Oral     SpO2 07/31/17 0955 98 %     Weight 07/31/17 0955 215 lb (97.5 kg)     Height 07/31/17 0955 5\' 7"  (1.702 m)     Head Circumference --      Peak Flow --      Pain Score 07/31/17 0953 8     Pain Loc --      Pain Edu? --      Excl. in [place]? --      Constitutional: Alert and oriented. Well appearing and in no acute distress. Eyes: Conjunctivae are normal. PERRL. EOMI. Head: Atraumatic. ENT:      Ears:      Nose: No congestion/rhinnorhea.      Mouth/Throat: Mucous membranes  are moist.  Neck: No stridor.   Cardiovascular: Normal rate, regular rhythm.  Good peripheral circulation. Respiratory: Normal respiratory effort without tachypnea or retractions. Lungs CTAB. Good air entry to the bases with no decreased or absent breath sounds. Gastrointestinal: Bowel sounds 4 quadrants. Soft and nontender to palpation.  Musculoskeletal: Full range of motion to all extremities. No gross deformities appreciated. Minimal tenderness to palpation over lumbar spine. Tenderness to palpation over right SI joint. Positive straight leg raise. Strength 5 out of 5 in lower extremities bilaterally. Neurologic:  Normal speech and language. No gross focal neurologic deficits are appreciated.  Skin:  Skin is warm, dry and intact. No rash noted. Psychiatric: Mood and affect are normal. Speech and behavior are normal. Patient exhibits appropriate insight and judgement.   ____________________________________________   LABS (all labs ordered are listed, but only abnormal results are displayed)  Labs Reviewed  GLUCOSE, CAPILLARY - Abnormal; Notable for the following:       Result Value   Glucose-Capillary 165 (*)    All other components within normal limits  CBG MONITORING, ED   ____________________________________________  EKG   ____________________________________________  RADIOLOGY Robinette Haines, personally viewed and evaluated these images (plain radiographs) as part of my medical decision making, as well as reviewing the written report by the radiologist.  Dg Lumbar Spine Complete  Result Date: 07/31/2017 CLINICAL DATA:  Increasing low back pain over the last 2 weeks. Pain extends down the right leg. EXAM: LUMBAR SPINE - COMPLETE 4+ VIEW COMPARISON:  03/25/2008 FINDINGS: Five lumbar type vertebral bodies show normal alignment. There has been previous discectomy and fusion at L4-5 with interbody fusion material and left-sided pedicle screws and posterior rod. Interspinous  device is present also at the L4-5 level. There is mild disc space narrowing at L3-4. The degenerative changes at L3-4 have worsened since 2009. IMPRESSION: No acute finding by radiography. Previous discectomy and fusion at L4-5. Some worsening of degenerative spondylosis at L3-4 when compared to previous study of 2009. Electronically Signed   By: Nelson Chimes M.D.   On: 07/31/2017 11:11    ____________________________________________    PROCEDURES  Procedure(s) performed:    Procedures    Medications  methylPREDNISolone sodium succinate (SOLU-MEDROL) 125 mg/2 mL injection 80 mg (80 mg Intramuscular Given 07/31/17 1210)  ketorolac (TORADOL) 30 MG/ML injection 30 mg (30 mg Intravenous Given 07/31/17 1213)     ____________________________________________   INITIAL IMPRESSION / ASSESSMENT AND PLAN / ED COURSE  Pertinent labs & imaging results that were available during my care of the patient were reviewed by me and considered in my medical decision making (see chart for details).  Review of the [place] CSRS was  performed in accordance of the [place] prior to dispensing any controlled drugs.   She presented to the emergency department for evaluation of low back pain for 1 week. Patient's diagnosis is consistent with sciatica. Vital signs and exam are reassuring. No acute abnormalities on lumbar x-ray. X-ray shows mild disc space narrowing and degenerative changes. Patient has already tried muscle relaxers and anti-inflammatories. We discussed that steroids will elevate his blood sugar and patient agreed to monitor his blood sugar closely. Blood sugar was measured at 165 in ED. Patient will be discharged home with prescriptions for prednisone and toradol. Patient is to follow up with PCP this week. Patient is given ED precautions to return to the ED for any worsening or new symptoms.     ____________________________________________  FINAL CLINICAL IMPRESSION(S) / ED DIAGNOSES  Final  diagnoses:  Sciatica of right side      NEW MEDICATIONS STARTED DURING THIS VISIT:  Discharge Medication List as of 07/31/2017 12:17 PM    START taking these medications   Details  predniSONE (DELTASONE) 10 MG tablet Take 6 tablets on day 1, take 5 tablets on day 2, take 4 tablets on day 3, take 3 tablets on day 4, take 2 tablets on day 5, take 1 tablet on day 6, Print            This chart was dictated using voice recognition software/Dragon. Despite best efforts to proofread, errors can occur which can change the meaning. Any change was purely unintentional.    Laban Emperor, PA-C 07/31/17 1407    Lavonia Drafts, MD 07/31/17 1430

## 2018-03-07 ENCOUNTER — Other Ambulatory Visit: Payer: Self-pay

## 2018-03-07 ENCOUNTER — Encounter: Payer: Self-pay | Admitting: Emergency Medicine

## 2018-03-07 ENCOUNTER — Emergency Department: Payer: BC Managed Care – PPO

## 2018-03-07 DIAGNOSIS — R079 Chest pain, unspecified: Secondary | ICD-10-CM | POA: Insufficient documentation

## 2018-03-07 DIAGNOSIS — Z5321 Procedure and treatment not carried out due to patient leaving prior to being seen by health care provider: Secondary | ICD-10-CM | POA: Insufficient documentation

## 2018-03-07 LAB — CBC
HCT: 41.9 % (ref 40.0–52.0)
HEMOGLOBIN: 14.1 g/dL (ref 13.0–18.0)
MCH: 27.8 pg (ref 26.0–34.0)
MCHC: 33.5 g/dL (ref 32.0–36.0)
MCV: 83 fL (ref 80.0–100.0)
Platelets: 160 10*3/uL (ref 150–440)
RBC: 5.06 MIL/uL (ref 4.40–5.90)
RDW: 13.7 % (ref 11.5–14.5)
WBC: 8.7 10*3/uL (ref 3.8–10.6)

## 2018-03-07 LAB — BASIC METABOLIC PANEL
ANION GAP: 6 (ref 5–15)
BUN: 11 mg/dL (ref 6–20)
CO2: 24 mmol/L (ref 22–32)
Calcium: 8.9 mg/dL (ref 8.9–10.3)
Chloride: 105 mmol/L (ref 101–111)
Creatinine, Ser: 0.79 mg/dL (ref 0.61–1.24)
GFR calc non Af Amer: >60 mL/min (ref >60)
GLUCOSE: 260 mg/dL — AB (ref 65–99)
Potassium: 3.7 mmol/L (ref 3.5–5.1)
Sodium: 135 mmol/L (ref 135–145)

## 2018-03-07 LAB — TROPONIN I: Troponin I: <0.03 ng/mL (ref <0.03)

## 2018-03-07 NOTE — ED Triage Notes (Signed)
Pt to triage via w/c with no distress noted; c/o left sided CP radiating into scapula with left arm numbness for several days; denies hx of same

## 2018-03-08 ENCOUNTER — Telehealth: Payer: Self-pay | Admitting: Emergency Medicine

## 2018-03-08 ENCOUNTER — Encounter: Payer: Self-pay | Admitting: Emergency Medicine

## 2018-03-08 ENCOUNTER — Emergency Department
Admission: EM | Admit: 2018-03-08 | Discharge: 2018-03-08 | Disposition: A | Discharge status: Home / Self Care | Payer: BC Managed Care – PPO | Attending: Emergency Medicine | Admitting: Emergency Medicine

## 2018-03-08 ENCOUNTER — Emergency Department
Admission: EM | Admit: 2018-03-08 | Discharge: 2018-03-08 | Disposition: A | Discharge status: Home / Self Care | Payer: BC Managed Care – PPO | Source: Home / Self Care

## 2018-03-08 ENCOUNTER — Emergency Department: Payer: BC Managed Care – PPO

## 2018-03-08 DIAGNOSIS — E119 Type 2 diabetes mellitus without complications: Secondary | ICD-10-CM | POA: Insufficient documentation

## 2018-03-08 DIAGNOSIS — Z7984 Long term (current) use of oral hypoglycemic drugs: Secondary | ICD-10-CM | POA: Insufficient documentation

## 2018-03-08 DIAGNOSIS — Z79899 Other long term (current) drug therapy: Secondary | ICD-10-CM | POA: Insufficient documentation

## 2018-03-08 DIAGNOSIS — I10 Essential (primary) hypertension: Secondary | ICD-10-CM | POA: Insufficient documentation

## 2018-03-08 DIAGNOSIS — Z87891 Personal history of nicotine dependence: Secondary | ICD-10-CM | POA: Insufficient documentation

## 2018-03-08 DIAGNOSIS — M25512 Pain in left shoulder: Secondary | ICD-10-CM | POA: Insufficient documentation

## 2018-03-08 DIAGNOSIS — R079 Chest pain, unspecified: Secondary | ICD-10-CM | POA: Insufficient documentation

## 2018-03-08 LAB — CBC
HEMATOCRIT: 42.5 % (ref 40.0–52.0)
HEMOGLOBIN: 14.2 g/dL (ref 13.0–18.0)
MCH: 27.7 pg (ref 26.0–34.0)
MCHC: 33.4 g/dL (ref 32.0–36.0)
MCV: 82.7 fL (ref 80.0–100.0)
Platelets: 149 10*3/uL — ABNORMAL LOW (ref 150–440)
RBC: 5.13 MIL/uL (ref 4.40–5.90)
RDW: 14 % (ref 11.5–14.5)
WBC: 6.8 10*3/uL (ref 3.8–10.6)

## 2018-03-08 LAB — BASIC METABOLIC PANEL
ANION GAP: 7 (ref 5–15)
BUN: 9 mg/dL (ref 6–20)
CO2: 22 mmol/L (ref 22–32)
Calcium: 9 mg/dL (ref 8.9–10.3)
Chloride: 105 mmol/L (ref 101–111)
Creatinine, Ser: 0.75 mg/dL (ref 0.61–1.24)
GFR calc Af Amer: >60 mL/min (ref >60)
Glucose, Bld: 332 mg/dL — ABNORMAL HIGH (ref 65–99)
POTASSIUM: 3.8 mmol/L (ref 3.5–5.1)
SODIUM: 134 mmol/L — AB (ref 135–145)

## 2018-03-08 LAB — TROPONIN I: Troponin I: <0.03 ng/mL (ref <0.03)

## 2018-03-08 NOTE — ED Provider Notes (Signed)
[facility] Emergency Department Provider Note   ____________________________________________   First MD Initiated Contact with Patient 03/08/18 1740     (approximate)  I have reviewed the triage vital signs and the nursing notes.   HISTORY  Chief Complaint Chest Pain    HPI Gregg Hernandez is a 62 y.o. male patient reports left-sided chest pain rating up into the left shoulder between the shoulder and the neck and then down into the left arm for last 4 days.  He was here last night but left after waiting for hours.  He came back today.  He reports that the pain is worse with movement of the arm or shoulder and better when his wife massages it but then it comes back very quickly.  Patient does not get worse with exertion is not short of breath the pain is not worse with deep breathing into the achy pain.  Patient is not nauseated.  Patient does have diabetes and hypertension.   Past Medical History:  Diagnosis Date  . Diabetes mellitus without complication ([place]) 5638  . Hypertension     Patient Active Problem List   Diagnosis Date Noted  . Encounter for screening colonoscopy 02/07/2015    Past Surgical History:  Procedure Laterality Date  . BACK SURGERY  2008  . COLONOSCOPY N/A 03/19/2015   Procedure: COLONOSCOPY;  Surgeon: Robert Bellow, MD;  Location: [facility] ENDOSCOPY;  Service: Endoscopy;  Laterality: N/A;  . SPINE SURGERY  2008    lower back ruptured disk, Dr Elvia Collum    Prior to Admission medications   Medication Sig Start Date End Date Taking? Authorizing Provider  amLODipine-benazepril (LOTREL) 5-20 MG per capsule Take 1 capsule by mouth daily. Every morning 01/25/15   [provider]  etodolac (LODINE) 400 MG tablet Take 1 tablet (400 mg total) by mouth 2 (two) times daily. 07/25/17   Johnn Hai, PA-C  HYDROcodone-acetaminophen (NORCO/VICODIN) 5-325 MG tablet Take 1 tablet by mouth every 6 (six) hours as needed  for moderate pain. 07/25/17   Johnn Hai, PA-C  metFORMIN (GLUCOPHAGE) 500 MG tablet Take 500 mg by mouth 2 (two) times daily with a meal.  01/25/15   [provider]  methocarbamol (ROBAXIN) 500 MG tablet 1-2 tablets every 6 hours prn muscle spasms 07/25/17   Johnn Hai, PA-C  predniSONE (DELTASONE) 10 MG tablet Take 6 tablets on day 1, take 5 tablets on day 2, take 4 tablets on day 3, take 3 tablets on day 4, take 2 tablets on day 5, take 1 tablet on day 6 07/31/17   Laban Emperor, PA-C    Allergies Patient has no known allergies.  History reviewed. No pertinent family history.  Social History Social History   Tobacco Use  . Smoking status: Former Smoker    Packs/day: 0.50    Years: 20.00    Pack years: 10.00    Types: Cigarettes  . Smokeless tobacco: Never Used  Substance Use Topics  . Alcohol use: No    Alcohol/week: 0.0 oz  . Drug use: No    Review of Systems  Constitutional: No fever/chills Eyes: No visual changes. ENT: No sore throat. Cardiovascular:  chest pain. Respiratory: Denies shortness of breath. Gastrointestinal: No abdominal pain.  No nausea, no vomiting.  No diarrhea.  No constipation. Genitourinary: Negative for dysuria. Musculoskeletal: Negative for back pain. Skin: Negative for rash. Neurological: Negative for headaches, focal weakness  ____________________________________________   PHYSICAL EXAM:  VITAL  SIGNS: ED Triage Vitals  Enc Vitals Group     BP 03/08/18 1228 137/76     Pulse Rate 03/08/18 1228 78     Resp 03/08/18 1228 18     Temp 03/08/18 1228 98.2 F (36.8 C)     Temp Source 03/08/18 1228 Oral     SpO2 03/08/18 1228 96 %     Weight 03/08/18 1225 235 lb (106.6 kg)     Height 03/08/18 1225 5\' 7"  (1.702 m)     Head Circumference --      Peak Flow --      Pain Score 03/08/18 1225 4     Pain Loc --      Pain Edu? --      Excl. in [place]? --     Constitutional: Alert and oriented. Well appearing and in no acute  distress. Eyes: Conjunctivae are normal. Head: Atraumatic. Nose: No congestion/rhinnorhea. Mouth/Throat: Mucous membranes are moist.  Oropharynx non-erythematous. Neck: No stridor.  Cardiovascular: Normal rate, regular rhythm. Grossly normal heart sounds.  Good peripheral circulation.  Patient's pain is reproduced exactly by palpation over the trapezius area between the neck and the shoulder joint. Respiratory: Normal respiratory effort.  No retractions. Lungs CTAB. Gastrointestinal: Soft and nontender. No distention. No abdominal bruits. No CVA tenderness. Musculoskeletal: No lower extremity tenderness nor edema.  No joint effusions. Neurologic:  Normal speech and language. No gross focal neurologic deficits are appreciated. Skin:  Skin is warm, dry and intact. No rash noted. Psychiatric: Mood and affect are normal. Speech and behavior are normal.  ____________________________________________   LABS (all labs ordered are listed, but only abnormal results are displayed)  Labs Reviewed  BASIC METABOLIC PANEL - Abnormal; Notable for the following components:      Result Value   Sodium 134 (*)    Glucose, Bld 332 (*)    All other components within normal limits  CBC - Abnormal; Notable for the following components:   Platelets 149 (*)    All other components within normal limits  TROPONIN I  TROPONIN I   ____________________________________________  EKG  EKG read interpreted by me shows a rate of 70 left axis no acute changes when compared to the EKG from yesterday it looks very similar. ____________________________________________  RADIOLOGY  ED MD interpretation: Chest x-ray reviewed I do not see any acute changes there  Official radiology report(s): Dg Chest 2 View  Result Date: 03/08/2018 CLINICAL DATA:  Left-sided chest pain and arm pain for the past 4 days. History of hypertension and diabetes, bronchitis, former smoker. EXAM: CHEST - 2 VIEW COMPARISON:  Chest x-ray of  March 07, 2018 FINDINGS: The lungs are adequately inflated. The interstitial markings are coarse. There is no alveolar infiltrate or pleural effusion. The heart and pulmonary vascularity are normal. The mediastinum is normal in width. The trachea is midline. The bony thorax exhibits no acute abnormality. IMPRESSION: Mild bronchitic-smoking related changes, stable. No pneumonia, CHF, nor other acute cardiopulmonary abnormality. Electronically Signed   By: David  Martinique M.D.   On: 03/08/2018 13:21   Dg Chest 2 View  Result Date: 03/07/2018 CLINICAL DATA:  Left-sided chest pain radiating to the scapula with left arm numbness for several days. History of hypertension and diabetes. EXAM: CHEST - 2 VIEW COMPARISON:  11/12/2013 FINDINGS: Normal heart size and pulmonary vascularity. No focal airspace disease or consolidation in the lungs. No blunting of costophrenic angles. No pneumothorax. Mediastinal contours appear intact. Degenerative changes in the spine. IMPRESSION: No active  cardiopulmonary disease. Electronically Signed   By: Lucienne Capers M.D.   On: 03/07/2018 21:16    ____________________________________________   PROCEDURES  Procedure(s) performed:   Procedures  Critical Care performed:   ____________________________________________   INITIAL IMPRESSION / ASSESSMENT AND PLAN / ED COURSE  Discussed with patient the fact that the EKG looks okay troponins been negative x3 since yesterday patient's pain is reproduced by movement exactly and by palpation.  It is unlikely to be cardiac although he is older and diabetic and hypertensive so I will have him follow-up with cardiology.  I recommend him heat let his wife massages some warm showers or hot showers do not fall asleep on a heating pad.  If he is not better in a week I should recommend he follow-up with orthopedics.  I gave him instructions on how to do range of motion exercises for the shoulder so he does not get a frozen shoulder.  He  has been not using the arm to drive for the last few days         ____________________________________________   FINAL CLINICAL IMPRESSION(S) / ED DIAGNOSES  Final diagnoses:  Acute pain of left shoulder     ED Discharge Orders    None       Note:  This document was prepared using Dragon voice recognition software and may include unintentional dictation errors.    Nena Polio, MD 03/08/18 [phone]

## 2018-03-08 NOTE — Discharge Instructions (Addendum)
Please use a heating pad 20 minutes every hour or so.  Be careful not to fall asleep on it.  You can use hot showers and ask your wife to massage it again.  You can take 2 or 3 of the over-the-counter Motrin 3 times a day with food for the next 2 days to see if that helps as well.  Remember the range of motion exercises I showed you for your shoulder.  Do not keep your shoulder from moving.  Please call the cardiologist tomorrow to get follow-up.  You are old enough and have diabetes and high blood pressure we have to be careful with this.  It does not look like there is any heart trouble right now though.  I I would also recommend calling the orthopedic surgeon and having a follow-up appointment set up in about a week.  He can always cancel this if you are better.  Please return for worse pain tightness shortness of breath sweating or any other problems.

## 2018-03-08 NOTE — ED Triage Notes (Signed)
Pt c/o left sided chest pain that radiates into the left arm for the past 4 days, states he was here last night but left after waiting 4 hrs.

## 2018-03-08 NOTE — ED Notes (Signed)
FIRST NURSE NOTE: pt c/o left chest and arm pain, states he was here last night and waited 4hrs and then LWBS.

## 2018-03-08 NOTE — Telephone Encounter (Signed)
Called patient due to lwot to inquire about condition and follow up plans. Left message.   

## 2018-03-10 ENCOUNTER — Telehealth: Payer: Self-pay

## 2018-03-10 NOTE — Telephone Encounter (Signed)
Lmov for patient to call back  He was seen in ED on 03/08/18 Will call back at a later time

## 2018-03-14 NOTE — Telephone Encounter (Signed)
Lmov for patient to call back  He was seen in ED on 03/08/18 Will call back at a later time

## 2018-03-16 NOTE — Telephone Encounter (Signed)
Lmov for patient to call back  He was seen in ED on4/24/19 Will call back at a later time

## 2018-03-20 NOTE — Telephone Encounter (Signed)
Pt scheduled nothing else needed.

## 2018-04-20 ENCOUNTER — Ambulatory Visit: Payer: BC Managed Care – PPO | Admitting: Cardiovascular Disease

## 2018-06-14 ENCOUNTER — Telehealth: Payer: Self-pay | Admitting: Cardiovascular Disease

## 2018-06-14 ENCOUNTER — Encounter

## 2018-06-14 ENCOUNTER — Ambulatory Visit: Payer: BC Managed Care – PPO | Admitting: Cardiovascular Disease

## 2018-06-14 NOTE — Telephone Encounter (Signed)
Per Dr Rockey Situ do not Reschedule

## 2018-06-14 NOTE — Telephone Encounter (Signed)
-----   Message from Minna Merritts, MD sent at 06/14/2018  2:12 PM EDT ----- Appears to be no show today I would not rebook I have reviewed the chart Was seen in the emergency room for musculoskeletal issue not cardiac thx TG

## 2018-06-14 NOTE — Progress Notes (Deleted)
Cardiology Office Note  Date:  06/14/2018   ID:  Gregg Hernandez, DOB 10/29/1956, MRN [id number]  PCP:  Albina Billet, MD   No chief complaint on file.   HPI:    Diabetes Hypertension  seen in the emergency room 03/08/2018 Had symptoms of chest pain left side radiating to left shoulder and neck for 4 days Presented day before to emergency room but left came back next day Worse with movement arm and shoulder better when wife massages it but comes back quickly Does not get worse with exertion No shortness of breath  Felt to be unlikely cardiac etiology Recommended heating pad and orthopedics   PMH:   has a past medical history of Diabetes mellitus without complication ([place]) (4097) and Hypertension.  PSH:    Past Surgical History:  Procedure Laterality Date  . BACK SURGERY  2008  . COLONOSCOPY N/A 03/19/2015   Procedure: COLONOSCOPY;  Surgeon: Robert Bellow, MD;  Location: [facility] ENDOSCOPY;  Service: Endoscopy;  Laterality: N/A;  . SPINE SURGERY  2008    lower back ruptured disk, Dr Elvia Collum    Current Outpatient Medications  Medication Sig Dispense Refill  . amLODipine-benazepril (LOTREL) 5-20 MG per capsule Take 1 capsule by mouth daily. Every morning    . etodolac (LODINE) 400 MG tablet Take 1 tablet (400 mg total) by mouth 2 (two) times daily. 20 tablet 0  . HYDROcodone-acetaminophen (NORCO/VICODIN) 5-325 MG tablet Take 1 tablet by mouth every 6 (six) hours as needed for moderate pain. 15 tablet 0  . metFORMIN (GLUCOPHAGE) 500 MG tablet Take 500 mg by mouth 2 (two) times daily with a meal.     . methocarbamol (ROBAXIN) 500 MG tablet 1-2 tablets every 6 hours prn muscle spasms 30 tablet 0  . predniSONE (DELTASONE) 10 MG tablet Take 6 tablets on day 1, take 5 tablets on day 2, take 4 tablets on day 3, take 3 tablets on day 4, take 2 tablets on day 5, take 1 tablet on day 6 21 tablet 0   No current facility-administered medications for this visit.       Allergies:   Patient has no known allergies.   Social History:  The patient  reports that he has quit smoking. His smoking use included cigarettes. He has a 10.00 pack-year smoking history. He has never used smokeless tobacco. He reports that he does not drink alcohol or use drugs.   Family History:   family history is not on file.    Review of Systems: ROS   PHYSICAL EXAM: VS:  There were no vitals taken for this visit. , BMI There is no height or weight on file to calculate BMI. GEN: Well nourished, well developed, in no acute distress HEENT: normal Neck: no JVD, carotid bruits, or masses Cardiac: RRR; no murmurs, rubs, or gallops,no edema  Respiratory:  clear to auscultation bilaterally, normal work of breathing GI: soft, nontender, nondistended, + BS MS: no deformity or atrophy Skin: warm and dry, no rash Neuro:  Strength and sensation are intact Psych: euthymic mood, full affect    Recent Labs: 03/08/2018: BUN 9; Creatinine, Ser 0.75; Hemoglobin 14.2; Platelets 149; Potassium 3.8; Sodium 134    Lipid Panel No results found for: CHOL, HDL, LDLCALC, TRIG    Wt Readings from Last 3 Encounters:  03/08/18 175 lb (79.4 kg)  03/07/18 235 lb (106.6 kg)  07/31/17 215 lb (97.5 kg)       ASSESSMENT AND PLAN:  No  diagnosis found.   Disposition:   F/U  6 months  No orders of the defined types were placed in this encounter.    Signed, Esmond Plants, M.D., Ph.D. 06/14/2018  [place], [place]

## 2018-09-17 IMAGING — CR DG CHEST 2V
1 series · 2 of 2 positions shown · non-contrast
Comparison: 11/12/2013

CLINICAL DATA: Left-sided chest pain radiating to the scapula with
left arm numbness for several days. History of hypertension and
diabetes.

EXAM:
CHEST - 2 VIEW

[Series 1: dg chest 2 view · 0.14mm/px · 2 of 2 slices shown]
[im 1/2]
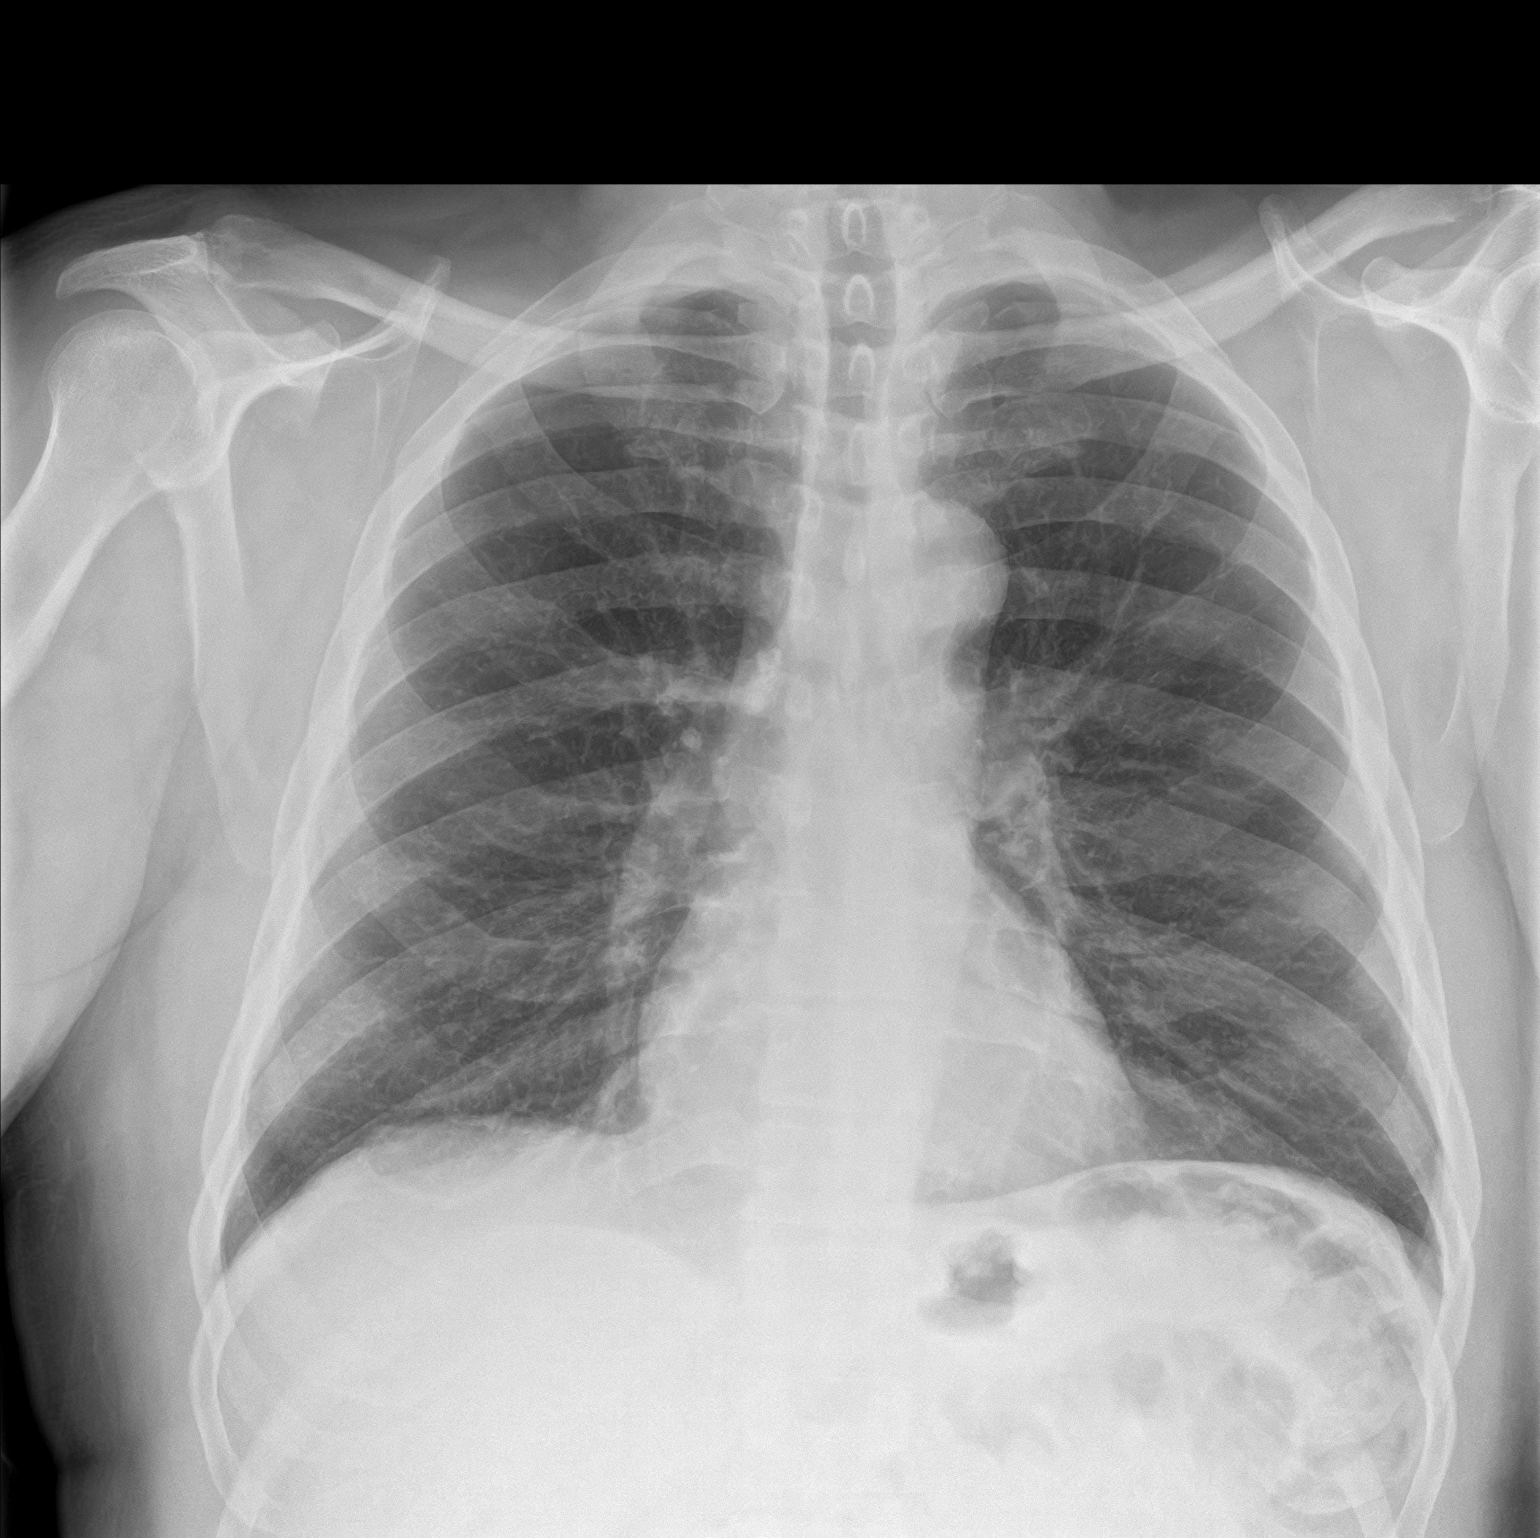
[im 2/2]
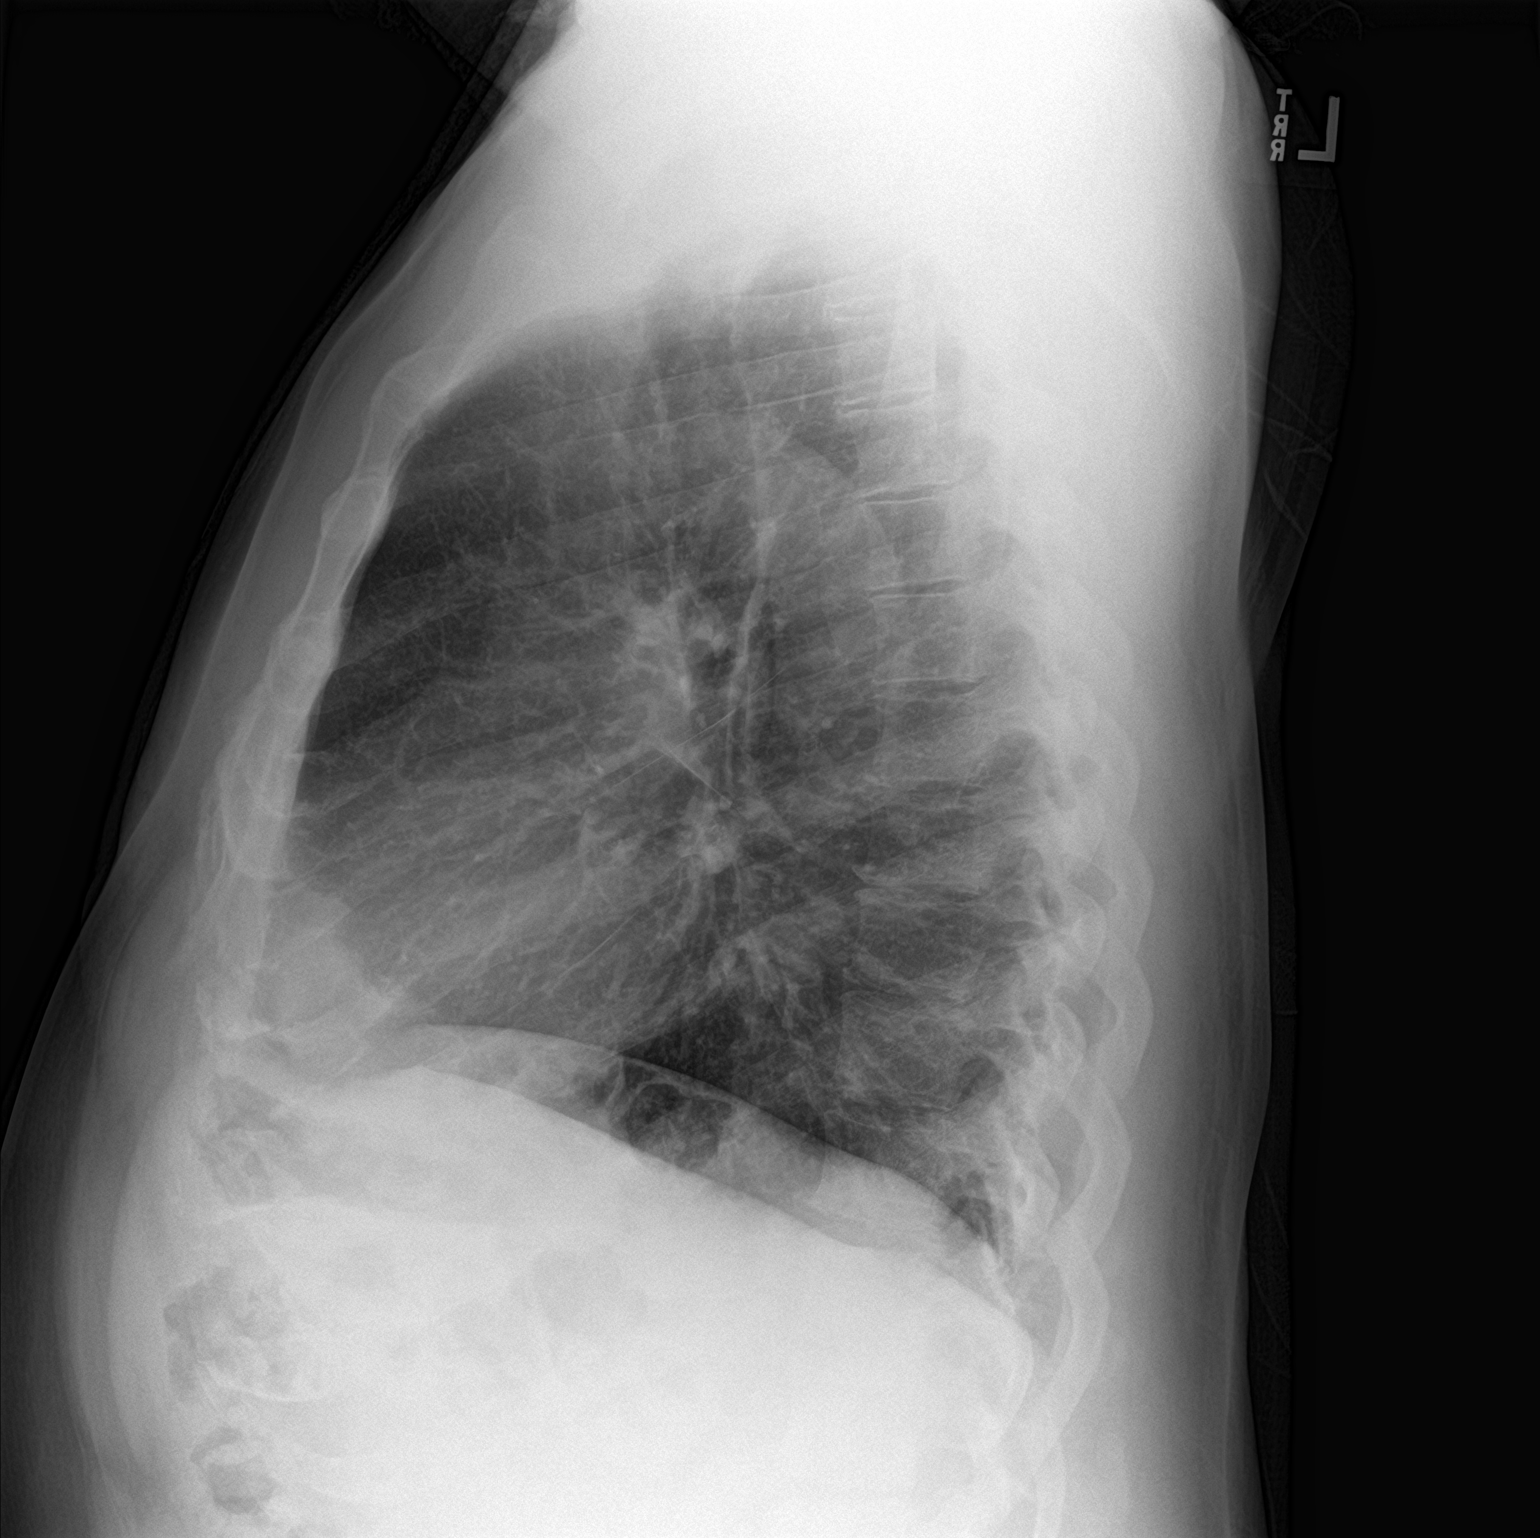

[2 of 2 positions shown; findings below may reference images not displayed]

FINDINGS: Normal heart size and pulmonary vascularity. No focal airspace
disease or consolidation in the lungs. No blunting of costophrenic
angles. No pneumothorax. Mediastinal contours appear intact.
Degenerative changes in the spine.
IMPRESSION: No active cardiopulmonary disease.

## 2019-06-14 ENCOUNTER — Encounter: Payer: Self-pay | Admitting: General Surgery

## 2019-08-27 ENCOUNTER — Ambulatory Visit (INDEPENDENT_AMBULATORY_CARE_PROVIDER_SITE_OTHER): Payer: BC Managed Care – PPO

## 2019-08-27 ENCOUNTER — Encounter: Payer: Self-pay | Admitting: Podiatry

## 2019-08-27 ENCOUNTER — Ambulatory Visit (INDEPENDENT_AMBULATORY_CARE_PROVIDER_SITE_OTHER): Payer: BC Managed Care – PPO | Admitting: Podiatry

## 2019-08-27 ENCOUNTER — Other Ambulatory Visit: Payer: Self-pay

## 2019-08-27 VITALS — BP 150/80 | HR 69 | Resp 16

## 2019-08-27 DIAGNOSIS — M722 Plantar fascial fibromatosis: Secondary | ICD-10-CM | POA: Diagnosis not present

## 2019-08-27 MED ORDER — MELOXICAM 15 MG PO TABS: 15.0000 mg | ORAL_TABLET | Freq: Every day | ORAL | 3 refills | Status: AC

## 2019-08-27 NOTE — Progress Notes (Signed)
  Subjective:  Patient ID: Gregg Hernandez, male    DOB: 01/30/1956,  MRN: [id number] HPI Chief Complaint  Patient presents with  . Foot Pain    Plantar heel left - aching x several months, AM pain, was intermittent, but now constant, tried soaks and Tylenol-no help  . New Patient (Initial Visit)    63 y.o. male presents with the above complaint.   ROS: He denies fever chills nausea vomiting muscle aches pains calf pain back pain chest pain shortness of breath.  Past Medical History:  Diagnosis Date  . Diabetes mellitus without complication ([place]) 123456  . Hypertension    Past Surgical History:  Procedure Laterality Date  . BACK SURGERY  2008  . COLONOSCOPY N/A 03/19/2015   Procedure: COLONOSCOPY;  Surgeon: Robert Bellow, MD;  Location: [facility] ENDOSCOPY;  Service: Endoscopy;  Laterality: N/A;  . SPINE SURGERY  2008    lower back ruptured disk, Dr Ronnald Ramp, [name]    Current Outpatient Medications:  .  amLODipine-benazepril (LOTREL) 5-20 MG per capsule, Take 1 capsule by mouth daily. Every morning, Disp: , Rfl:  .  meloxicam (MOBIC) 15 MG tablet, Take 1 tablet (15 mg total) by mouth daily., Disp: 30 tablet, Rfl: 3 .  metFORMIN (GLUCOPHAGE) 500 MG tablet, Take 500 mg by mouth 2 (two) times daily with a meal. , Disp: , Rfl:   No Known Allergies Review of Systems Objective:   Vitals:   08/27/19 1431  BP: (!) 150/80  Pulse: 69  Resp: 16    General: Well developed, nourished, in no acute distress, alert and oriented x3   Dermatological: Skin is warm, dry and supple bilateral. Nails x 10 are well maintained; remaining integument appears unremarkable at this time. There are no open sores, no preulcerative lesions, no rash or signs of infection present.  Vascular: Dorsalis Pedis artery and Posterior Tibial artery pedal pulses are 2/4 bilateral with immedate capillary fill time. Pedal hair growth present. No varicosities and no lower extremity edema present bilateral.    Neruologic: Grossly intact via light touch bilateral. Vibratory intact via tuning fork bilateral. Protective threshold with Semmes Wienstein monofilament intact to all pedal sites bilateral. Patellar and Achilles deep tendon reflexes 2+ bilateral. No Babinski or clonus noted bilateral.   Musculoskeletal: No gross boney pedal deformities bilateral. No pain, crepitus, or limitation noted with foot and ankle range of motion bilateral. Muscular strength 5/5 in all groups tested bilateral.  Pain on palpation medial calcaneal tubercle of the left heel. Gait: Unassisted, Nonantalgic.    Radiographs:  Radiographs taken today demonstrate no acute findings.  Soft tissue increase in density plantar fashion calcaneal insertion site left heel.  Assessment & Plan:   Assessment: Plantar fasciitis left heel.  Plan: Discussed etiology pathology conservative versus surgical therapies discussed appropriate shoe gear stretching exercise ice therapy and shoe gear modifications.  After sterile Betadine skin prep I injected 20 mg Kenalog 5 mg Marcaine point maximal tenderness.  Start him on Medrol Dosepak to be followed by meloxicam.  Follow-up with him in 1 month     Sibel Khurana T. [place], [place]

## 2019-08-27 NOTE — Patient Instructions (Signed)

## 2019-10-03 ENCOUNTER — Other Ambulatory Visit: Payer: Self-pay

## 2019-10-03 ENCOUNTER — Encounter: Payer: Self-pay | Admitting: Podiatry

## 2019-10-03 ENCOUNTER — Ambulatory Visit (INDEPENDENT_AMBULATORY_CARE_PROVIDER_SITE_OTHER): Payer: BC Managed Care – PPO | Admitting: Podiatry

## 2019-10-03 DIAGNOSIS — M722 Plantar fascial fibromatosis: Secondary | ICD-10-CM

## 2019-10-03 NOTE — Progress Notes (Signed)
He presents today for follow-up of his plantar fasciitis of his left foot.  He states that is tender every now and then but nothing like it was when I first came in here.  He states that is about 90% improved.  Objective: Vital signs are stable he is alert and oriented x3 pulses are palpable left.  He has mild tenderness on palpation medial calcaneal tubercle of the left heel.  No pain on medial and lateral compression of the calcaneus.  Assessment: Plantar fasciitis 90% resolved left.  Plan: Reinjected his left heel again today 20 mg Kenalog 5 mg Marcaine point of maximal tenderness.  He is should continue all of his current therapies otherwise.  We will follow-up with him in 1 month if necessary.  May need to consider orthotics at that time.

## 2019-11-07 ENCOUNTER — Other Ambulatory Visit: Payer: Self-pay | Admitting: Orthopedic Surgery

## 2019-11-07 ENCOUNTER — Ambulatory Visit: Payer: BC Managed Care – PPO | Admitting: Podiatry

## 2019-11-07 ENCOUNTER — Other Ambulatory Visit: Payer: Self-pay | Admitting: Urology

## 2019-11-07 DIAGNOSIS — R319 Hematuria, unspecified: Secondary | ICD-10-CM

## 2019-11-21 ENCOUNTER — Other Ambulatory Visit: Payer: Self-pay

## 2019-11-21 ENCOUNTER — Ambulatory Visit
Admission: RE | Admit: 2019-11-21 | Discharge: 2019-11-21 | Disposition: A | Discharge status: Home / Self Care | Payer: BC Managed Care – PPO | Source: Ambulatory Visit | Attending: Urology | Admitting: Urology

## 2019-11-21 DIAGNOSIS — R319 Hematuria, unspecified: Secondary | ICD-10-CM | POA: Insufficient documentation

## 2019-11-21 MED ORDER — IOHEXOL 300 MG/ML  SOLN
125.0000 mL | Freq: Once | INTRAMUSCULAR | Status: AC | PRN
  Administered 2019-11-21: 125 mL via INTRAVENOUS

## 2019-12-17 ENCOUNTER — Encounter: Payer: Self-pay | Admitting: Podiatry

## 2019-12-17 ENCOUNTER — Ambulatory Visit (INDEPENDENT_AMBULATORY_CARE_PROVIDER_SITE_OTHER): Payer: BC Managed Care – PPO | Admitting: Podiatry

## 2019-12-17 ENCOUNTER — Other Ambulatory Visit: Payer: Self-pay

## 2019-12-17 DIAGNOSIS — M722 Plantar fascial fibromatosis: Secondary | ICD-10-CM

## 2019-12-17 MED ORDER — MELOXICAM 15 MG PO TABS: 15.0000 mg | ORAL_TABLET | Freq: Every day | ORAL | 3 refills | Status: AC

## 2019-12-17 MED ORDER — METHYLPREDNISOLONE 4 MG PO TBPK: ORAL_TABLET | ORAL | 0 refills | Status: AC

## 2019-12-17 NOTE — Progress Notes (Signed)
He presents today stated that his foot flared up again about 2 weeks ago states that he did not think about getting his boot out or taking his medicine.  Objective: Vital signs are stable he is alert oriented x3 he has pain on palpation medial calcaneal tubercle of the left foot.  Assessment: Plantar fasciitis.  Plan: Reinjected the left foot today methylprednisolone was prescribed followed by meloxicam.  Also requested a visit with Liliane Channel for orthotics.

## 2019-12-19 ENCOUNTER — Encounter: Payer: Self-pay | Admitting: Podiatry

## 2019-12-19 ENCOUNTER — Ambulatory Visit (INDEPENDENT_AMBULATORY_CARE_PROVIDER_SITE_OTHER): Payer: BC Managed Care – PPO | Admitting: Orthotics

## 2019-12-19 ENCOUNTER — Other Ambulatory Visit: Payer: Self-pay

## 2019-12-19 DIAGNOSIS — M722 Plantar fascial fibromatosis: Secondary | ICD-10-CM | POA: Diagnosis not present

## 2019-12-19 NOTE — Progress Notes (Signed)

## 2020-01-16 ENCOUNTER — Other Ambulatory Visit: Payer: BC Managed Care – PPO | Admitting: Orthotics

## 2020-04-01 ENCOUNTER — Other Ambulatory Visit: Payer: Self-pay | Admitting: General Surgery

## 2020-04-15 ENCOUNTER — Other Ambulatory Visit
Admission: RE | Admit: 2020-04-15 | Discharge: 2020-04-15 | Disposition: A | Discharge status: Home / Self Care | Payer: Medicare PPO | Source: Ambulatory Visit | Attending: General Surgery | Admitting: General Surgery

## 2020-04-15 ENCOUNTER — Other Ambulatory Visit: Payer: Self-pay

## 2020-04-15 ENCOUNTER — Encounter: Payer: Self-pay | Admitting: General Surgery

## 2020-04-15 DIAGNOSIS — Z01812 Encounter for preprocedural laboratory examination: Secondary | ICD-10-CM | POA: Insufficient documentation

## 2020-04-15 DIAGNOSIS — Z20822 Contact with and (suspected) exposure to covid-19: Secondary | ICD-10-CM | POA: Insufficient documentation

## 2020-04-15 LAB — SARS CORONAVIRUS 2 (TAT 6-24 HRS): SARS Coronavirus 2: NEGATIVE

## 2020-04-16 ENCOUNTER — Ambulatory Visit: Payer: Medicare PPO | Admitting: Certified Registered Nurse Anesthetist

## 2020-04-16 ENCOUNTER — Ambulatory Visit
Admission: RE | Admit: 2020-04-16 | Discharge: 2020-04-16 | Disposition: A | Discharge status: Home / Self Care | Payer: Medicare PPO | Attending: General Surgery | Admitting: General Surgery

## 2020-04-16 ENCOUNTER — Other Ambulatory Visit: Payer: Self-pay

## 2020-04-16 ENCOUNTER — Encounter
Admission: RE | Disposition: A | Discharge status: Home / Self Care | Payer: Self-pay | Source: Home / Self Care | Attending: General Surgery

## 2020-04-16 DIAGNOSIS — Z1211 Encounter for screening for malignant neoplasm of colon: Secondary | ICD-10-CM | POA: Diagnosis not present

## 2020-04-16 DIAGNOSIS — I10 Essential (primary) hypertension: Secondary | ICD-10-CM | POA: Insufficient documentation

## 2020-04-16 DIAGNOSIS — E119 Type 2 diabetes mellitus without complications: Secondary | ICD-10-CM | POA: Diagnosis not present

## 2020-04-16 DIAGNOSIS — Z7984 Long term (current) use of oral hypoglycemic drugs: Secondary | ICD-10-CM | POA: Insufficient documentation

## 2020-04-16 DIAGNOSIS — Z79899 Other long term (current) drug therapy: Secondary | ICD-10-CM | POA: Diagnosis not present

## 2020-04-16 DIAGNOSIS — Z86010 Personal history of colonic polyps: Secondary | ICD-10-CM | POA: Diagnosis not present

## 2020-04-16 DIAGNOSIS — Z87891 Personal history of nicotine dependence: Secondary | ICD-10-CM | POA: Diagnosis not present

## 2020-04-16 DIAGNOSIS — D123 Benign neoplasm of transverse colon: Secondary | ICD-10-CM | POA: Diagnosis not present

## 2020-04-16 HISTORY — PX: COLONOSCOPY WITH PROPOFOL: SHX5780

## 2020-04-16 LAB — GLUCOSE, CAPILLARY: Glucose-Capillary: 278 mg/dL — ABNORMAL HIGH (ref 70–99)

## 2020-04-16 SURGERY — COLONOSCOPY WITH PROPOFOL
Anesthesia: General

## 2020-04-16 MED ORDER — SODIUM CHLORIDE 0.9 % IV SOLN
INTRAVENOUS | Status: DC
  Administered 2020-04-16: 1000 mL via INTRAVENOUS

## 2020-04-16 MED ORDER — LIDOCAINE HCL (PF) 2 % IJ SOLN
INTRAMUSCULAR | Status: AC
  Filled 2020-04-16: qty 5

## 2020-04-16 MED ORDER — LIDOCAINE HCL (CARDIAC) PF 100 MG/5ML IV SOSY
PREFILLED_SYRINGE | INTRAVENOUS | Status: DC | PRN
  Administered 2020-04-16: 50 mg via INTRAVENOUS

## 2020-04-16 MED ORDER — PROPOFOL 10 MG/ML IV BOLUS
INTRAVENOUS | Status: DC | PRN
  Administered 2020-04-16: 70 mg via INTRAVENOUS

## 2020-04-16 MED ORDER — PROPOFOL 500 MG/50ML IV EMUL
INTRAVENOUS | Status: AC
  Filled 2020-04-16: qty 50

## 2020-04-16 MED ORDER — PROPOFOL 500 MG/50ML IV EMUL
INTRAVENOUS | Status: DC | PRN
  Administered 2020-04-16: 175 ug/kg/min via INTRAVENOUS

## 2020-04-16 NOTE — Anesthesia Postprocedure Evaluation (Signed)
Anesthesia Post Note  Patient: LADARIAN TUR  Procedure(s) Performed: COLONOSCOPY WITH PROPOFOL (N/A )  Patient location during evaluation: Endoscopy Anesthesia Type: General Level of consciousness: awake and alert Pain management: pain level controlled Vital Signs Assessment: post-procedure vital signs reviewed and stable Respiratory status: spontaneous breathing, nonlabored ventilation, respiratory function stable and patient connected to nasal cannula oxygen Cardiovascular status: blood pressure returned to baseline and stable Postop Assessment: no apparent nausea or vomiting Anesthetic complications: no     Last Vitals:  Vitals:   04/16/20 1054 04/16/20 1104  BP: (!) 150/97 (!) 158/95  Pulse: (!) 54 64  Resp: 16 (!) 21  Temp:    SpO2: 98% 98%    Last Pain:  Vitals:   04/16/20 1104  TempSrc:   PainSc: 0-No pain                 Arita Miss

## 2020-04-16 NOTE — H&P (Signed)
Gregg Hernandez [id number] 09-04-1956     HPI:  Patient with past history of sigmoid and ascending colon polyps.  Ate until 8 PM yesterday, reporting being unaware of request for clear liquid diet prior to prep.  Reports tolerating the prep well and stools are liquid this AM.  Will proceed with planned colonoscopy.   Medications Prior to Admission  Medication Sig Dispense Refill Last Dose  . amLODipine-benazepril (LOTREL) 5-20 MG per capsule Take 1 capsule by mouth daily. Every morning   Past Week at Unknown time  . CHANTIX 0.5 MG tablet Take 0.5 mg by mouth 2 (two) times daily.   Past Week at Unknown time  . JARDIANCE 10 MG TABS tablet Take 10 mg by mouth daily.   Past Week at Unknown time  . meloxicam (MOBIC) 15 MG tablet Take 1 tablet (15 mg total) by mouth daily. 30 tablet 3 Past Week at Unknown time  . meloxicam (MOBIC) 15 MG tablet Take 1 tablet (15 mg total) by mouth daily. 30 tablet 3 Past Week at Unknown time  . metFORMIN (GLUCOPHAGE) 500 MG tablet Take 500 mg by mouth 2 (two) times daily with a meal.    Past Week at Unknown time  . methylPREDNISolone (MEDROL DOSEPAK) 4 MG TBPK tablet 6 day dose pack - take as directed 21 tablet 0 Past Week at Unknown time   No Known Allergies Past Medical History:  Diagnosis Date  . Diabetes mellitus without complication (Gregg Hernandez) 123456   Pt takes Metformin  . Hypertension    Past Surgical History:  Procedure Laterality Date  . BACK SURGERY  2008  . COLONOSCOPY N/A 03/19/2015   Procedure: COLONOSCOPY;  Surgeon: Robert Bellow, MD;  Location: [facility] ENDOSCOPY;  Service: Endoscopy;  Laterality: N/A;  . SPINE SURGERY  2008    lower back ruptured disk, Dr Elvia Collum   Social History   Socioeconomic History  . Marital status: Married    Spouse name: Not on file  . Number of children: Not on file  . Years of education: Not on file  . Highest education level: Not on file  Occupational History  . Not on file  Tobacco Use  . Smoking  status: Former Smoker    Packs/day: 0.50    Years: 20.00    Pack years: 10.00    Types: Cigarettes  . Smokeless tobacco: Never Used  Substance and Sexual Activity  . Alcohol use: No    Alcohol/week: 0.0 standard drinks  . Drug use: No  . Sexual activity: Not on file  Other Topics Concern  . Not on file  Social History Narrative  . Not on file   Social Determinants of Health   Financial Resource Strain:   . Difficulty of Paying Living Expenses:   Food Insecurity:   . Worried About Charity fundraiser in the Last Year:   . Arboriculturist in the Last Year:   Transportation Needs:   . Film/video editor (Medical):   Gregg Hernandez Kitchen Lack of Transportation (Non-Medical):   Physical Activity:   . Days of Exercise per Week:   . Minutes of Exercise per Session:   Stress:   . Feeling of Stress :   Social Connections:   . Frequency of Communication with Friends and Family:   . Frequency of Social Gatherings with Friends and Family:   . Attends Religious Services:   . Active Member of Clubs or Organizations:   . Attends Archivist Meetings:   .  Marital Status:   Intimate Partner Violence:   . Fear of Current or Ex-Partner:   . Emotionally Abused:   Gregg Hernandez Kitchen Physically Abused:   . Sexually Abused:    Social History   Social History Narrative  . Not on file     ROS: Negative.     PE: HEENT: Negative. Lungs: Clear. Cardio: RR.  Assessment/Plan:  Proceed with planned endoscopy.  [name] [place] 04/16/2020

## 2020-04-16 NOTE — Anesthesia Procedure Notes (Signed)
Date/Time: 04/16/2020 10:06 AM Performed by: Johnna Acosta, CRNA Pre-anesthesia Checklist: Patient identified, Emergency Drugs available, Suction available, Timeout performed and Patient being monitored Patient Re-evaluated:Patient Re-evaluated prior to induction Oxygen Delivery Method: Nasal cannula Preoxygenation: Pre-oxygenation with 100% oxygen Induction Type: IV induction

## 2020-04-16 NOTE — Anesthesia Preprocedure Evaluation (Signed)
Anesthesia Evaluation  Patient identified by MRN, date of birth, ID band Patient awake    Reviewed: Allergy & Precautions, NPO status , Patient's Chart, lab work & pertinent test results  History of Anesthesia Complications Negative for: history of anesthetic complications  Airway Mallampati: II  TM Distance: >3 FB Neck ROM: Full    Dental  (+) Edentulous Lower, Edentulous Upper   Pulmonary neg pulmonary ROS, neg sleep apnea, neg COPD, Patient abstained from smoking.Not current smoker, former smoker,    Pulmonary exam normal breath sounds clear to auscultation       Cardiovascular Exercise Tolerance: Good METShypertension, Pt. on medications (-) CAD and (-) Past MI (-) dysrhythmias  Rhythm:Regular Rate:Normal - Systolic murmurs    Neuro/Psych negative neurological ROS  negative psych ROS   GI/Hepatic neg GERD  ,(+)     (-) substance abuse  ,   Endo/Other  diabetes, Oral Hypoglycemic Agents  Renal/GU negative Renal ROS     Musculoskeletal   Abdominal   Peds  Hematology   Anesthesia Other Findings Past Medical History: 2015: Diabetes mellitus without complication ([place])     Comment:  Pt takes Metformin No date: Hypertension  Reproductive/Obstetrics                             Anesthesia Physical Anesthesia Plan  ASA: II  Anesthesia Plan: General   Post-op Pain Management:    Induction: Intravenous  PONV Risk Score and Plan: 2 and Ondansetron, Propofol infusion and TIVA  Airway Management Planned: Nasal Cannula  Additional Equipment: None  Intra-op Plan:   Post-operative Plan:   Informed Consent: I have reviewed the patients History and Physical, chart, labs and discussed the procedure including the risks, benefits and alternatives for the proposed anesthesia with the patient or authorized representative who has indicated his/her understanding and acceptance.     Dental  advisory given  Plan Discussed with: CRNA and Surgeon  Anesthesia Plan Comments: (Discussed risks of anesthesia with patient, including possibility of difficulty with spontaneous ventilation under anesthesia necessitating airway intervention, PONV, and rare risks such as cardiac or respiratory or neurological events. Patient understands.)        Anesthesia Quick Evaluation

## 2020-04-16 NOTE — Op Note (Signed)
[facility] Gastroenterology Patient Name: Gregg Hernandez Procedure Date: 04/16/2020 9:59 AM MRN: [id number] Account #: [id number] Date of Birth: 1956-03-09 Admit Type: Outpatient Age: 64 Room: [facility] ENDO ROOM 1 Gender: Male Note Status: Finalized Procedure:             Colonoscopy Indications:           High risk colon cancer surveillance: Personal history                         of colonic polyps Providers:             Robert Bellow, MD Referring MD:          Leona Carry. Hall Busing, MD (Referring MD) Medicines:             Monitored Anesthesia Care Complications:         No immediate complications. Procedure:             Pre-Anesthesia Assessment:                        - Prior to the procedure, a History and Physical was                         performed, and patient medications, allergies and                         sensitivities were reviewed. The patient's tolerance                         of previous anesthesia was reviewed.                        - The risks and benefits of the procedure and the                         sedation options and risks were discussed with the                         patient. All questions were answered and informed                         consent was obtained.                        After obtaining informed consent, the colonoscope was                         passed under direct vision. Throughout the procedure,                         the patient's blood pressure, pulse, and oxygen                         saturations were monitored continuously. The                         Colonoscope was introduced through the anus and                         advanced to the the cecum,  identified by appendiceal                         orifice and ileocecal valve. The colonoscopy was                         performed without difficulty. The patient tolerated                         the procedure well. The quality of the bowel   preparation was good. Findings:      Two sessile polyps were found in the transverse colon. The polyps were 5       mm in size. These were biopsied with a cold large-capacity forceps for       histology.      The retroflexed view of the distal rectum and anal verge was normal and       showed no anal or rectal abnormalities. Impression:            - Two 5 mm polyps in the transverse colon. Biopsied.                        - The distal rectum and anal verge are normal on                         retroflexion view. Recommendation:        - Repeat colonoscopy in 5 years for surveillance.                        - Telephone endoscopist for pathology results in 1                         week. Procedure Code(s):     --- Professional ---                        [phone], Colonoscopy, flexible; with biopsy, single or                         multiple Diagnosis Code(s):     --- Professional ---                        Z86.010, Personal history of colonic polyps                        K63.5, Polyp of colon CPT copyright 2019 [employer]. All rights reserved. The codes documented in this report are preliminary and upon coder review may  be revised to meet current compliance requirements. Robert Bellow, MD 04/16/2020 10:31:45 AM This report has been signed electronically. Number of Addenda: 0 Note Initiated On: 04/16/2020 9:59 AM Scope Withdrawal Time: 0 hours 13 minutes 42 seconds  Total Procedure Duration: 0 hours 20 minutes 59 seconds       [facility]

## 2020-04-16 NOTE — Transfer of Care (Signed)
Immediate Anesthesia Transfer of Care Note  Patient: Gregg Hernandez  Procedure(s) Performed: COLONOSCOPY WITH PROPOFOL (N/A )  Patient Location: PACU  Anesthesia Type:General  Level of Consciousness: sedated  Airway & Oxygen Therapy: Patient Spontanous Breathing  Post-op Assessment: Report given to RN and Post -op Vital signs reviewed and stable  Post vital signs: Reviewed and stable  Last Vitals:  Vitals Value Taken Time  BP 112/74 04/16/20 1036  Temp 36.4 C 04/16/20 1034  Pulse 72 04/16/20 1036  Resp 17 04/16/20 1036  SpO2 98 % 04/16/20 1036    Last Pain:  Vitals:   04/16/20 1034  TempSrc:   PainSc: Asleep         Complications: No apparent anesthesia complications

## 2020-04-17 ENCOUNTER — Encounter: Payer: Self-pay | Admitting: *Deleted

## 2020-04-17 LAB — SURGICAL PATHOLOGY

## 2020-06-16 ENCOUNTER — Other Ambulatory Visit: Payer: Self-pay

## 2020-06-16 ENCOUNTER — Ambulatory Visit (LOCAL_COMMUNITY_HEALTH_CENTER): Payer: Self-pay

## 2020-06-16 DIAGNOSIS — Z719 Counseling, unspecified: Secondary | ICD-10-CM

## 2020-06-16 NOTE — Progress Notes (Addendum)
[facility]resents to [facility] for TB screening as required by job application process. 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 Per client, everyone that lived in the house was positive and had to be treated at the "old [place] up the street'. States only other time had skin test was in middle school when a teacher had TB and the class was tested. He states was positive again at that time. No available records regarding the above. Counseled that as no records, would need to repeat TB skin test and if positive, would send him for a no charge CXR. Client declined stating he knew it would blister, pop and be painful. Client's PCP is Dr. Benita Stabile and encouraged to contact him for CXR order and to do screening.  Consult with Hannah Beat RN, TB Coordinator and above is correct procedure. Rich Number, RN

## 2020-12-18 ENCOUNTER — Telehealth: Payer: Self-pay

## 2020-12-18 NOTE — Telephone Encounter (Signed)
Called pt to schedule appt for orthotic pick up. VM full 12/10/20, LVM 12/18/2020

## 2021-01-02 NOTE — Telephone Encounter (Signed)
LVM FOR OPU APPT

## 2024-08-21 NOTE — Progress Notes (Signed)
 Gregg Hernandez                                          MRN: [id number]   08/21/2024   The [facility] Quality Team Specialist reviewed this patient medical record for the purposes of chart review for care gap closure. The following were reviewed: chart review for care gap closure-kidney health evaluation for diabetes:eGFR  and uACR.    [facility] Quality Team

## 2024-08-23 NOTE — Progress Notes (Signed)
 Gregg Hernandez                                          MRN: [id number]   08/23/2024   The [facility] Quality Team Specialist reviewed this patient medical record for the purposes of chart review for care gap closure. The following were reviewed: chart review for care gap closure-glycemic status assessment.    [facility] Quality Team
